# Patient Record
Sex: Female | Born: 1980 | Race: Black or African American | Hispanic: No | Marital: Married | Smoking: Never smoker
Health system: Southern US, Community
[De-identification: ages and names within clinical notes are randomized; demographics above are authoritative.]

## PROBLEM LIST (undated history)

## (undated) DIAGNOSIS — G935 Compression of brain: Secondary | ICD-10-CM

## (undated) DIAGNOSIS — R609 Edema, unspecified: Secondary | ICD-10-CM

## (undated) DIAGNOSIS — I370 Nonrheumatic pulmonary valve stenosis: Secondary | ICD-10-CM

## (undated) DIAGNOSIS — I10 Essential (primary) hypertension: Secondary | ICD-10-CM

## (undated) DIAGNOSIS — R55 Syncope and collapse: Secondary | ICD-10-CM

## (undated) HISTORY — DX: Compression of brain: G93.5

## (undated) HISTORY — PX: ROTATOR CUFF REPAIR: SHX139

## (undated) HISTORY — DX: Nonrheumatic pulmonary valve stenosis: I37.0

## (undated) HISTORY — DX: Syncope and collapse: R55

## (undated) HISTORY — PX: TUBAL LIGATION: SHX77

## (undated) HISTORY — DX: Essential (primary) hypertension: I10

## (undated) HISTORY — DX: Edema, unspecified: R60.9

---
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
## 2003-06-11 ENCOUNTER — Inpatient Hospital Stay (HOSPITAL_COMMUNITY): Admission: AD | Admit: 2003-06-11 | Discharge: 2003-06-11 | Payer: Self-pay | Admitting: *Deleted

## 2003-06-14 ENCOUNTER — Inpatient Hospital Stay (HOSPITAL_COMMUNITY): Admission: AD | Admit: 2003-06-14 | Discharge: 2003-06-14 | Payer: Self-pay | Admitting: Obstetrics and Gynecology

## 2003-09-20 ENCOUNTER — Inpatient Hospital Stay (HOSPITAL_COMMUNITY): Admission: AD | Admit: 2003-09-20 | Discharge: 2003-09-20 | Payer: Self-pay | Admitting: Obstetrics and Gynecology

## 2003-12-04 ENCOUNTER — Inpatient Hospital Stay (HOSPITAL_COMMUNITY): Admission: AD | Admit: 2003-12-04 | Discharge: 2003-12-04 | Payer: Self-pay | Admitting: Family Medicine

## 2003-12-05 ENCOUNTER — Inpatient Hospital Stay (HOSPITAL_COMMUNITY): Admission: AD | Admit: 2003-12-05 | Discharge: 2003-12-05 | Payer: Self-pay | Admitting: *Deleted

## 2004-01-31 ENCOUNTER — Inpatient Hospital Stay (HOSPITAL_COMMUNITY): Admission: AD | Admit: 2004-01-31 | Discharge: 2004-01-31 | Payer: Self-pay | Admitting: *Deleted

## 2004-02-02 ENCOUNTER — Inpatient Hospital Stay (HOSPITAL_COMMUNITY): Admission: AD | Admit: 2004-02-02 | Discharge: 2004-02-02 | Payer: Self-pay | Admitting: Obstetrics & Gynecology

## 2004-03-29 ENCOUNTER — Inpatient Hospital Stay (HOSPITAL_COMMUNITY): Admission: AD | Admit: 2004-03-29 | Discharge: 2004-03-29 | Payer: Self-pay | Admitting: Obstetrics and Gynecology

## 2004-04-21 ENCOUNTER — Inpatient Hospital Stay (HOSPITAL_COMMUNITY): Admission: AD | Admit: 2004-04-21 | Discharge: 2004-04-21 | Payer: Self-pay | Admitting: Obstetrics and Gynecology

## 2004-04-27 ENCOUNTER — Inpatient Hospital Stay (HOSPITAL_COMMUNITY): Admission: AD | Admit: 2004-04-27 | Discharge: 2004-04-27 | Payer: Self-pay | Admitting: Obstetrics & Gynecology

## 2004-06-20 ENCOUNTER — Ambulatory Visit: Payer: Self-pay | Admitting: Internal Medicine

## 2004-08-19 ENCOUNTER — Observation Stay (HOSPITAL_COMMUNITY): Admission: AD | Admit: 2004-08-19 | Discharge: 2004-08-19 | Payer: Self-pay | Admitting: Obstetrics and Gynecology

## 2004-08-30 ENCOUNTER — Inpatient Hospital Stay (HOSPITAL_COMMUNITY): Admission: AD | Admit: 2004-08-30 | Discharge: 2004-08-30 | Payer: Self-pay | Admitting: Obstetrics and Gynecology

## 2004-09-01 ENCOUNTER — Inpatient Hospital Stay (HOSPITAL_COMMUNITY): Admission: AD | Admit: 2004-09-01 | Discharge: 2004-09-01 | Payer: Self-pay | Admitting: Obstetrics & Gynecology

## 2004-09-03 ENCOUNTER — Inpatient Hospital Stay (HOSPITAL_COMMUNITY): Admission: AD | Admit: 2004-09-03 | Discharge: 2004-09-05 | Payer: Self-pay | Admitting: Obstetrics and Gynecology

## 2004-10-01 ENCOUNTER — Other Ambulatory Visit: Admission: RE | Admit: 2004-10-01 | Discharge: 2004-10-01 | Payer: Self-pay | Admitting: Obstetrics and Gynecology

## 2005-05-07 ENCOUNTER — Emergency Department (HOSPITAL_COMMUNITY): Admission: EM | Admit: 2005-05-07 | Discharge: 2005-05-07 | Payer: Self-pay | Admitting: Emergency Medicine

## 2005-06-27 ENCOUNTER — Emergency Department (HOSPITAL_COMMUNITY): Admission: EM | Admit: 2005-06-27 | Discharge: 2005-06-27 | Payer: Self-pay | Admitting: Emergency Medicine

## 2006-11-22 ENCOUNTER — Emergency Department (HOSPITAL_COMMUNITY): Admission: EM | Admit: 2006-11-22 | Discharge: 2006-11-22 | Payer: Self-pay | Admitting: Family Medicine

## 2007-06-05 ENCOUNTER — Emergency Department (HOSPITAL_COMMUNITY): Admission: EM | Admit: 2007-06-05 | Discharge: 2007-06-05 | Payer: Self-pay | Admitting: Emergency Medicine

## 2007-07-21 ENCOUNTER — Ambulatory Visit: Payer: Self-pay

## 2007-07-21 ENCOUNTER — Ambulatory Visit: Payer: Self-pay | Admitting: Cardiology

## 2007-07-21 LAB — CONVERTED CEMR LAB
AST: 13 units/L (ref 0–37)
Albumin: 4.1 g/dL (ref 3.5–5.2)
Basophils Absolute: 0 10*3/uL (ref 0.0–0.1)
Bilirubin, Direct: 0.1 mg/dL (ref 0.0–0.3)
Chloride: 102 meq/L (ref 96–112)
Creatinine, Ser: 0.4 mg/dL (ref 0.4–1.2)
Eosinophils Absolute: 0.2 10*3/uL (ref 0.0–0.6)
Eosinophils Relative: 3.5 % (ref 0.0–5.0)
Glucose, Bld: 91 mg/dL (ref 70–99)
HCT: 36.2 % (ref 36.0–46.0)
Hemoglobin: 12 g/dL (ref 12.0–15.0)
MCHC: 33.1 g/dL (ref 30.0–36.0)
MCV: 80.3 fL (ref 78.0–100.0)
Monocytes Absolute: 0.3 10*3/uL (ref 0.2–0.7)
Neutrophils Relative %: 38.3 % — ABNORMAL LOW (ref 43.0–77.0)
Potassium: 3.6 meq/L (ref 3.5–5.1)
RBC: 4.5 M/uL (ref 3.87–5.11)
RDW: 13.8 % (ref 11.5–14.6)
Sodium: 137 meq/L (ref 135–145)
TSH: 1.14 microintl units/mL (ref 0.35–5.50)
Total Bilirubin: 0.7 mg/dL (ref 0.3–1.2)
WBC: 4.5 10*3/uL (ref 4.5–10.5)

## 2007-07-30 ENCOUNTER — Ambulatory Visit: Payer: Self-pay

## 2007-07-30 ENCOUNTER — Encounter: Payer: Self-pay | Admitting: Internal Medicine

## 2007-08-27 ENCOUNTER — Ambulatory Visit: Payer: Self-pay | Admitting: Internal Medicine

## 2007-09-19 ENCOUNTER — Emergency Department (HOSPITAL_COMMUNITY): Admission: EM | Admit: 2007-09-19 | Discharge: 2007-09-19 | Payer: Self-pay | Admitting: Emergency Medicine

## 2008-01-09 ENCOUNTER — Inpatient Hospital Stay (HOSPITAL_COMMUNITY): Admission: AD | Admit: 2008-01-09 | Discharge: 2008-01-09 | Payer: Self-pay | Admitting: Obstetrics and Gynecology

## 2008-01-24 ENCOUNTER — Inpatient Hospital Stay (HOSPITAL_COMMUNITY): Admission: AD | Admit: 2008-01-24 | Discharge: 2008-01-24 | Payer: Self-pay | Admitting: Obstetrics and Gynecology

## 2008-02-11 ENCOUNTER — Emergency Department (HOSPITAL_COMMUNITY): Admission: EM | Admit: 2008-02-11 | Discharge: 2008-02-11 | Payer: Self-pay | Admitting: Emergency Medicine

## 2008-02-17 ENCOUNTER — Ambulatory Visit: Payer: Self-pay | Admitting: Internal Medicine

## 2008-02-17 ENCOUNTER — Inpatient Hospital Stay (HOSPITAL_COMMUNITY): Admission: AD | Admit: 2008-02-17 | Discharge: 2008-02-17 | Payer: Self-pay | Admitting: Obstetrics and Gynecology

## 2008-03-18 ENCOUNTER — Inpatient Hospital Stay (HOSPITAL_COMMUNITY): Admission: AD | Admit: 2008-03-18 | Discharge: 2008-03-18 | Payer: Self-pay | Admitting: Obstetrics and Gynecology

## 2008-03-20 ENCOUNTER — Ambulatory Visit: Payer: Self-pay | Admitting: Internal Medicine

## 2008-03-20 LAB — CONVERTED CEMR LAB: Cortisol, Plasma: 20.6 ug/dL

## 2008-04-13 ENCOUNTER — Inpatient Hospital Stay (HOSPITAL_COMMUNITY): Admission: AD | Admit: 2008-04-13 | Discharge: 2008-04-13 | Payer: Self-pay | Admitting: Obstetrics & Gynecology

## 2008-04-26 ENCOUNTER — Emergency Department (HOSPITAL_COMMUNITY): Admission: EM | Admit: 2008-04-26 | Discharge: 2008-04-26 | Payer: Self-pay | Admitting: Emergency Medicine

## 2008-04-26 ENCOUNTER — Inpatient Hospital Stay (HOSPITAL_COMMUNITY): Admission: RE | Admit: 2008-04-26 | Discharge: 2008-04-26 | Payer: Self-pay | Admitting: Obstetrics & Gynecology

## 2008-05-08 ENCOUNTER — Ambulatory Visit: Payer: Self-pay | Admitting: Internal Medicine

## 2008-06-08 ENCOUNTER — Ambulatory Visit: Payer: Self-pay | Admitting: Internal Medicine

## 2008-06-14 ENCOUNTER — Inpatient Hospital Stay (HOSPITAL_COMMUNITY): Admission: AD | Admit: 2008-06-14 | Discharge: 2008-06-14 | Payer: Self-pay | Admitting: Obstetrics and Gynecology

## 2008-06-22 ENCOUNTER — Ambulatory Visit: Payer: Self-pay | Admitting: Internal Medicine

## 2008-06-22 LAB — CONVERTED CEMR LAB
BUN: 4 mg/dL — ABNORMAL LOW (ref 6–23)
Basophils Absolute: 0 10*3/uL (ref 0.0–0.1)
Bilirubin Urine: NEGATIVE
Eosinophils Absolute: 0.1 10*3/uL (ref 0.0–0.7)
Eosinophils Relative: 1.9 % (ref 0.0–5.0)
GFR calc Af Amer: 193 mL/min
GFR calc non Af Amer: 160 mL/min
HCT: 35 % — ABNORMAL LOW (ref 36.0–46.0)
Hemoglobin, Urine: NEGATIVE
Ketones, ur: NEGATIVE mg/dL
MCHC: 33.5 g/dL (ref 30.0–36.0)
MCV: 85.2 fL (ref 78.0–100.0)
Monocytes Absolute: 0.6 10*3/uL (ref 0.1–1.0)
Neutrophils Relative %: 66.2 % (ref 43.0–77.0)
Platelets: 144 10*3/uL — ABNORMAL LOW (ref 150–400)
Potassium: 4 meq/L (ref 3.5–5.1)
RDW: 14.7 % — ABNORMAL HIGH (ref 11.5–14.6)
TSH: 0.92 microintl units/mL (ref 0.35–5.50)
Total Protein, Urine: NEGATIVE mg/dL
Urine Glucose: NEGATIVE mg/dL
Urobilinogen, UA: 0.2 (ref 0.0–1.0)

## 2008-07-10 ENCOUNTER — Ambulatory Visit: Payer: Self-pay

## 2008-07-10 ENCOUNTER — Encounter: Payer: Self-pay | Admitting: Internal Medicine

## 2008-07-10 ENCOUNTER — Ambulatory Visit: Payer: Self-pay | Admitting: Internal Medicine

## 2008-07-30 ENCOUNTER — Inpatient Hospital Stay (HOSPITAL_COMMUNITY): Admission: AD | Admit: 2008-07-30 | Discharge: 2008-07-30 | Payer: Self-pay | Admitting: Obstetrics and Gynecology

## 2008-07-30 ENCOUNTER — Inpatient Hospital Stay (HOSPITAL_COMMUNITY): Admission: AD | Admit: 2008-07-30 | Discharge: 2008-07-31 | Payer: Self-pay | Admitting: Obstetrics and Gynecology

## 2008-08-07 ENCOUNTER — Inpatient Hospital Stay (HOSPITAL_COMMUNITY): Admission: AD | Admit: 2008-08-07 | Discharge: 2008-08-07 | Payer: Self-pay | Admitting: Obstetrics and Gynecology

## 2008-08-20 ENCOUNTER — Observation Stay (HOSPITAL_COMMUNITY): Admission: AD | Admit: 2008-08-20 | Discharge: 2008-08-20 | Payer: Self-pay | Admitting: Obstetrics & Gynecology

## 2008-08-29 ENCOUNTER — Inpatient Hospital Stay (HOSPITAL_COMMUNITY): Admission: AD | Admit: 2008-08-29 | Discharge: 2008-08-31 | Payer: Self-pay | Admitting: Obstetrics & Gynecology

## 2008-09-25 ENCOUNTER — Ambulatory Visit: Payer: Self-pay | Admitting: Internal Medicine

## 2008-11-09 DIAGNOSIS — Z9889 Other specified postprocedural states: Secondary | ICD-10-CM | POA: Insufficient documentation

## 2008-11-09 DIAGNOSIS — R55 Syncope and collapse: Secondary | ICD-10-CM

## 2008-11-09 DIAGNOSIS — Z862 Personal history of diseases of the blood and blood-forming organs and certain disorders involving the immune mechanism: Secondary | ICD-10-CM | POA: Insufficient documentation

## 2008-11-20 ENCOUNTER — Encounter: Payer: Self-pay | Admitting: Internal Medicine

## 2008-11-20 ENCOUNTER — Ambulatory Visit: Payer: Self-pay | Admitting: Internal Medicine

## 2008-12-25 ENCOUNTER — Ambulatory Visit: Payer: Self-pay | Admitting: Cardiovascular Disease

## 2008-12-25 ENCOUNTER — Telehealth: Payer: Self-pay | Admitting: Internal Medicine

## 2008-12-25 DIAGNOSIS — R002 Palpitations: Secondary | ICD-10-CM

## 2008-12-25 DIAGNOSIS — R42 Dizziness and giddiness: Secondary | ICD-10-CM | POA: Insufficient documentation

## 2008-12-26 ENCOUNTER — Telehealth (INDEPENDENT_AMBULATORY_CARE_PROVIDER_SITE_OTHER): Payer: Self-pay | Admitting: *Deleted

## 2008-12-27 ENCOUNTER — Telehealth (INDEPENDENT_AMBULATORY_CARE_PROVIDER_SITE_OTHER): Payer: Self-pay | Admitting: *Deleted

## 2008-12-27 ENCOUNTER — Ambulatory Visit: Payer: Self-pay | Admitting: Cardiovascular Disease

## 2008-12-30 ENCOUNTER — Ambulatory Visit (HOSPITAL_COMMUNITY): Admission: RE | Admit: 2008-12-30 | Discharge: 2008-12-30 | Payer: Self-pay | Admitting: Cardiovascular Disease

## 2008-12-31 ENCOUNTER — Encounter: Payer: Self-pay | Admitting: Internal Medicine

## 2009-01-01 LAB — CONVERTED CEMR LAB
Basophils Absolute: 0 10*3/uL (ref 0.0–0.1)
Basophils Relative: 0.4 % (ref 0.0–3.0)
Eosinophils Absolute: 0.1 10*3/uL (ref 0.0–0.7)
Lymphocytes Relative: 40.9 % (ref 12.0–46.0)
MCHC: 33.5 g/dL (ref 30.0–36.0)
Monocytes Relative: 10.4 % (ref 3.0–12.0)
Neutrophils Relative %: 46.3 % (ref 43.0–77.0)
RBC: 4.44 M/uL (ref 3.87–5.11)
RDW: 13.5 % (ref 11.5–14.6)

## 2009-01-06 ENCOUNTER — Ambulatory Visit (HOSPITAL_COMMUNITY): Admission: RE | Admit: 2009-01-06 | Discharge: 2009-01-06 | Payer: Self-pay | Admitting: Cardiovascular Disease

## 2009-01-09 ENCOUNTER — Telehealth (INDEPENDENT_AMBULATORY_CARE_PROVIDER_SITE_OTHER): Payer: Self-pay | Admitting: *Deleted

## 2009-01-10 ENCOUNTER — Telehealth (INDEPENDENT_AMBULATORY_CARE_PROVIDER_SITE_OTHER): Payer: Self-pay | Admitting: *Deleted

## 2009-01-11 ENCOUNTER — Telehealth: Payer: Self-pay | Admitting: Internal Medicine

## 2009-01-11 ENCOUNTER — Ambulatory Visit: Payer: Self-pay | Admitting: Cardiovascular Disease

## 2009-01-11 ENCOUNTER — Telehealth (INDEPENDENT_AMBULATORY_CARE_PROVIDER_SITE_OTHER): Payer: Self-pay | Admitting: *Deleted

## 2009-01-11 ENCOUNTER — Ambulatory Visit: Payer: Self-pay | Admitting: Internal Medicine

## 2009-01-16 ENCOUNTER — Ambulatory Visit (HOSPITAL_COMMUNITY): Admission: RE | Admit: 2009-01-16 | Discharge: 2009-01-16 | Payer: Self-pay | Admitting: Internal Medicine

## 2009-01-16 LAB — CONVERTED CEMR LAB: hCG, Beta Chain, Quant, S: >29780 milliintl units/mL

## 2009-01-18 ENCOUNTER — Ambulatory Visit: Payer: Self-pay | Admitting: Internal Medicine

## 2009-01-18 DIAGNOSIS — R599 Enlarged lymph nodes, unspecified: Secondary | ICD-10-CM | POA: Insufficient documentation

## 2009-01-23 ENCOUNTER — Encounter: Payer: Self-pay | Admitting: Internal Medicine

## 2009-01-24 ENCOUNTER — Telehealth: Payer: Self-pay | Admitting: Internal Medicine

## 2009-01-25 ENCOUNTER — Telehealth: Payer: Self-pay | Admitting: Internal Medicine

## 2009-02-16 ENCOUNTER — Telehealth: Payer: Self-pay | Admitting: Internal Medicine

## 2009-02-20 ENCOUNTER — Encounter: Payer: Self-pay | Admitting: Internal Medicine

## 2009-02-21 ENCOUNTER — Telehealth: Payer: Self-pay | Admitting: Internal Medicine

## 2009-02-28 ENCOUNTER — Telehealth (INDEPENDENT_AMBULATORY_CARE_PROVIDER_SITE_OTHER): Payer: Self-pay | Admitting: *Deleted

## 2009-04-16 ENCOUNTER — Telehealth: Payer: Self-pay | Admitting: Internal Medicine

## 2009-04-26 ENCOUNTER — Ambulatory Visit: Payer: Self-pay | Admitting: Internal Medicine

## 2009-04-27 ENCOUNTER — Telehealth (INDEPENDENT_AMBULATORY_CARE_PROVIDER_SITE_OTHER): Payer: Self-pay | Admitting: *Deleted

## 2009-04-27 LAB — CONVERTED CEMR LAB
Basophils Absolute: 0 10*3/uL (ref 0.0–0.1)
Bilirubin Urine: NEGATIVE
CO2: 27 meq/L (ref 19–32)
Chloride: 102 meq/L (ref 96–112)
Creatinine,U: 130.5 mg/dL
Leukocytes, UA: NEGATIVE
Lymphocytes Relative: 36.6 % (ref 12.0–46.0)
Monocytes Relative: 8.3 % (ref 3.0–12.0)
Nitrite: NEGATIVE
Opiates: NEGATIVE
Phencyclidine (PCP): NEGATIVE
Platelets: 206 10*3/uL (ref 150.0–400.0)
Potassium: 3.2 meq/L — ABNORMAL LOW (ref 3.5–5.1)
Propoxyphene: NEGATIVE
RDW: 13.9 % (ref 11.5–14.6)
Sodium: 137 meq/L (ref 135–145)
Specific Gravity, Urine: >=1.03 (ref 1.000–1.030)
TSH: 0.84 microintl units/mL (ref 0.35–5.50)
hCG, Beta Chain, Quant, S: 0.71 milliintl units/mL
pH: 6 (ref 5.0–8.0)

## 2009-04-30 ENCOUNTER — Ambulatory Visit: Payer: Self-pay | Admitting: Internal Medicine

## 2009-05-01 ENCOUNTER — Encounter: Payer: Self-pay | Admitting: Internal Medicine

## 2009-05-02 LAB — CONVERTED CEMR LAB
BUN: 9 mg/dL
CO2: 28 meq/L
Calcium: 9.3 mg/dL
Chloride: 108 meq/L
Creatinine, Ser: 0.7 mg/dL
GFR calc non Af Amer: 129.49 mL/min
Glucose, Bld: 87 mg/dL
Potassium: 3.8 meq/L
Sodium: 140 meq/L

## 2009-05-03 ENCOUNTER — Encounter: Payer: Self-pay | Admitting: Internal Medicine

## 2009-05-09 ENCOUNTER — Encounter: Payer: Self-pay | Admitting: Internal Medicine

## 2009-05-09 ENCOUNTER — Ambulatory Visit: Payer: Self-pay | Admitting: Internal Medicine

## 2009-05-09 ENCOUNTER — Ambulatory Visit (HOSPITAL_COMMUNITY): Admission: RE | Admit: 2009-05-09 | Discharge: 2009-05-09 | Payer: Self-pay | Admitting: Internal Medicine

## 2009-05-11 ENCOUNTER — Ambulatory Visit: Payer: Self-pay | Admitting: Internal Medicine

## 2009-05-11 DIAGNOSIS — I10 Essential (primary) hypertension: Secondary | ICD-10-CM | POA: Insufficient documentation

## 2009-05-23 ENCOUNTER — Telehealth (INDEPENDENT_AMBULATORY_CARE_PROVIDER_SITE_OTHER): Payer: Self-pay | Admitting: *Deleted

## 2009-06-04 ENCOUNTER — Telehealth: Payer: Self-pay | Admitting: Internal Medicine

## 2009-06-14 ENCOUNTER — Telehealth (INDEPENDENT_AMBULATORY_CARE_PROVIDER_SITE_OTHER): Payer: Self-pay | Admitting: *Deleted

## 2009-06-15 ENCOUNTER — Ambulatory Visit: Payer: Self-pay | Admitting: Internal Medicine

## 2009-07-24 ENCOUNTER — Encounter: Payer: Self-pay | Admitting: Internal Medicine

## 2009-08-20 ENCOUNTER — Inpatient Hospital Stay (HOSPITAL_COMMUNITY): Admission: AD | Admit: 2009-08-20 | Discharge: 2009-08-20 | Payer: Self-pay | Admitting: Obstetrics & Gynecology

## 2009-08-20 ENCOUNTER — Telehealth: Payer: Self-pay | Admitting: Internal Medicine

## 2009-08-23 ENCOUNTER — Telehealth (INDEPENDENT_AMBULATORY_CARE_PROVIDER_SITE_OTHER): Payer: Self-pay | Admitting: *Deleted

## 2009-08-24 ENCOUNTER — Ambulatory Visit (HOSPITAL_COMMUNITY): Admission: RE | Admit: 2009-08-24 | Discharge: 2009-08-24 | Payer: Self-pay | Admitting: Obstetrics & Gynecology

## 2009-09-14 ENCOUNTER — Ambulatory Visit: Payer: Self-pay | Admitting: Internal Medicine

## 2009-11-09 ENCOUNTER — Telehealth: Payer: Self-pay | Admitting: Internal Medicine

## 2009-12-21 ENCOUNTER — Ambulatory Visit: Payer: Self-pay | Admitting: Internal Medicine

## 2010-01-21 IMAGING — US US OB COMP LESS 14 WK
1 series · 13 of 28 positions shown · non-contrast
Comparison: None this gestation.

presenting with crampy pelvic and lower abdominal pain.
Quantitative beta HCG 3303.

OBSTETRIC <14 WK US AND TRANSVAGINAL OB US 01/09/2008:
TECHNIQUE: Both transabdominal and transvaginal ultrasound
examinations were performed for complete evaluation of the
gestation as well as the maternal uterus, adnexal regions, and
pelvic cul-de-sac.

[Series 1: us ob comp less 14 wk · 0.28mm/px · 13 of 50 slices shown]
[im 2/50]
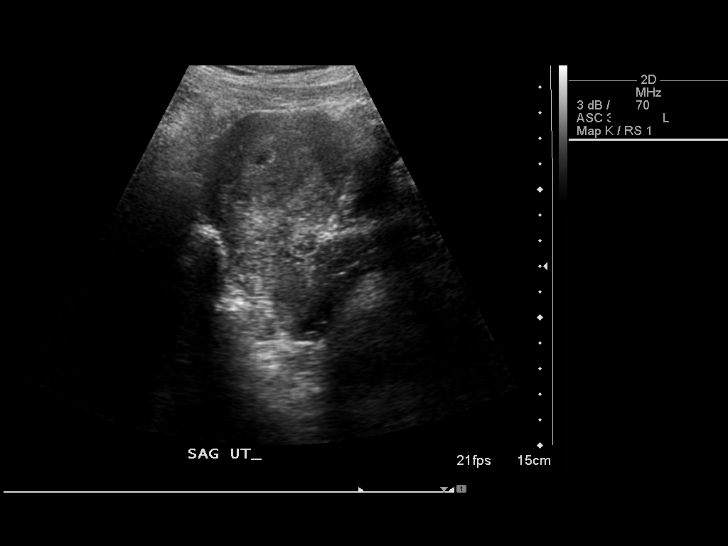
[im 6/50]
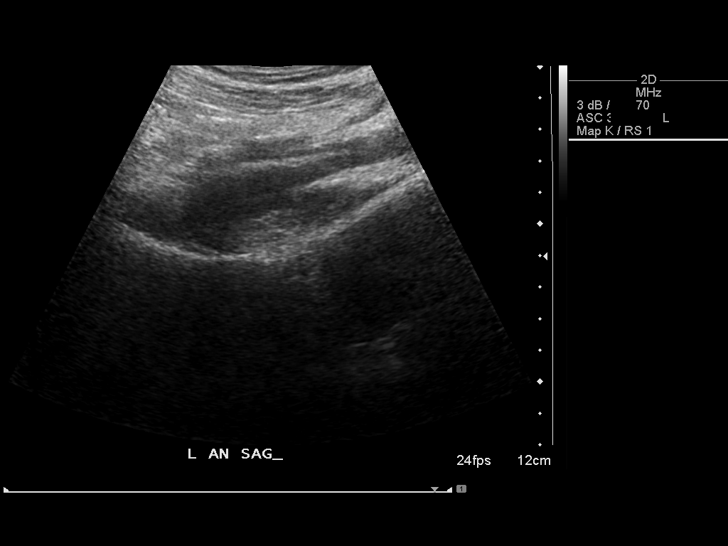
[im 10/50]
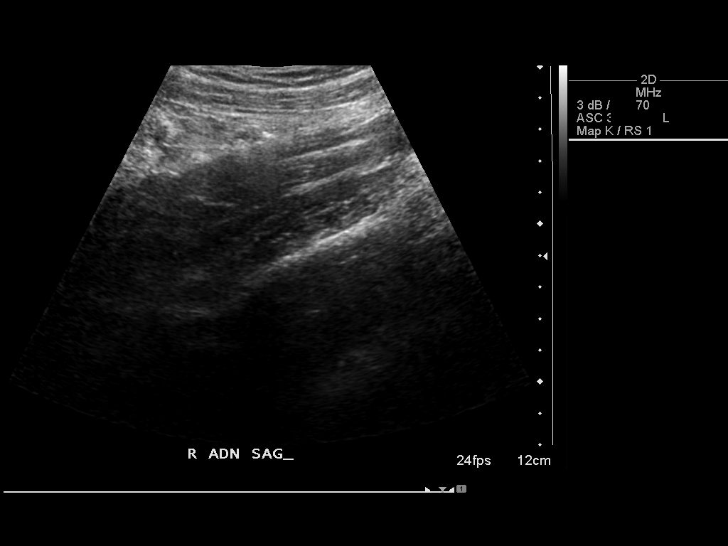
[im 13/50]
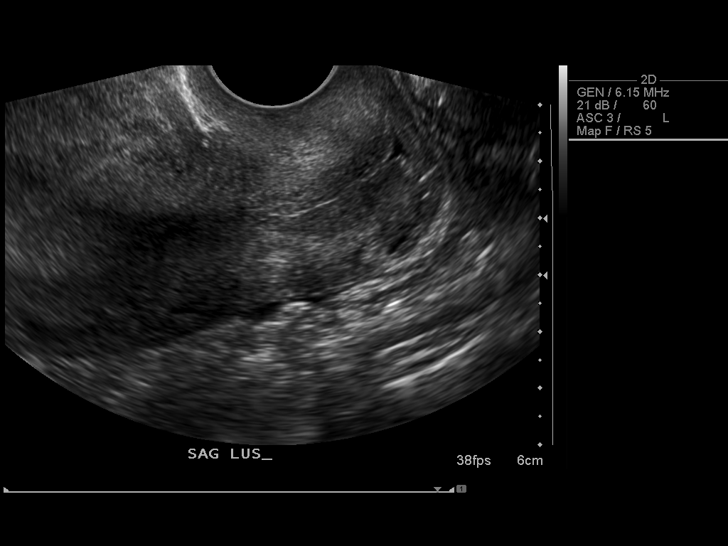
[im 17/50]
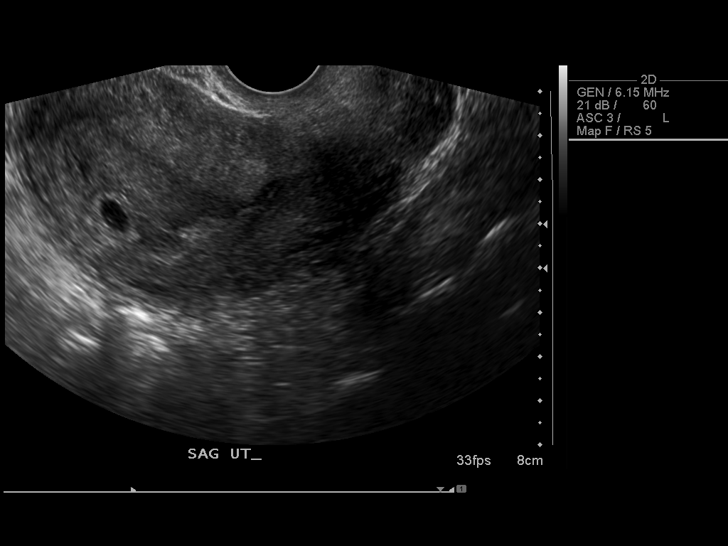
[im 20/50]
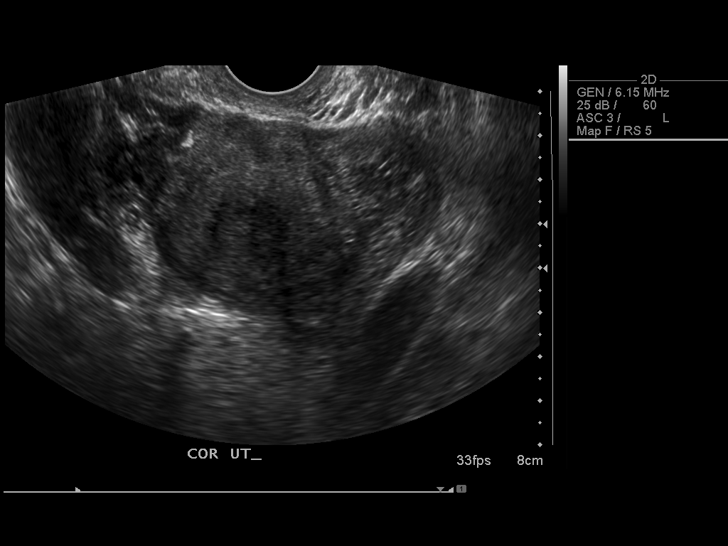
[im 26/50]
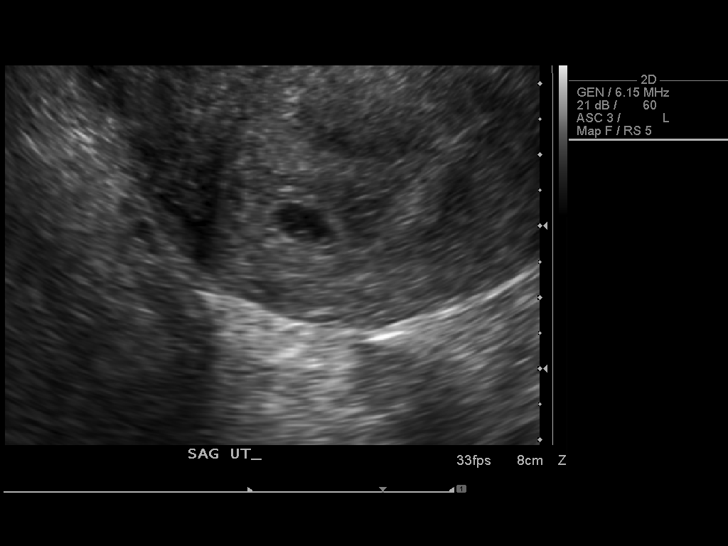
[im 30/50]
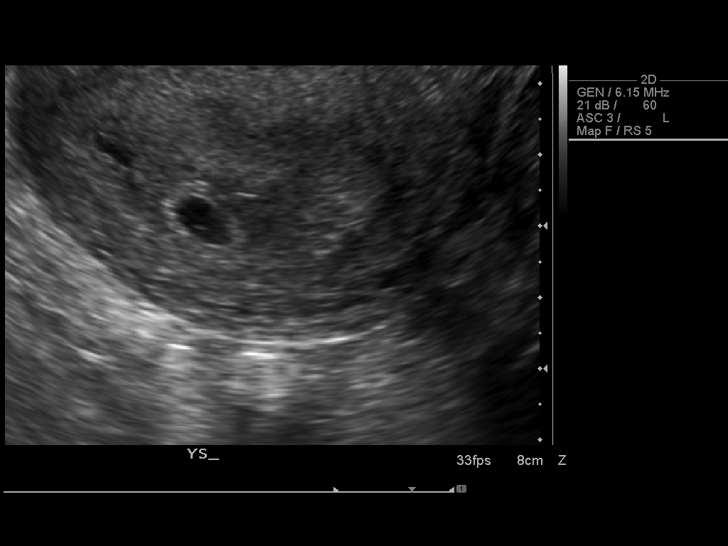
[im 33/50]
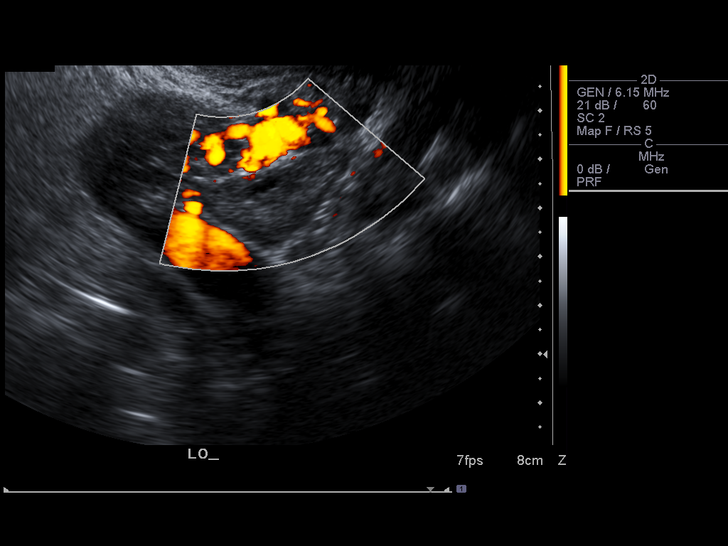
[im 37/50]
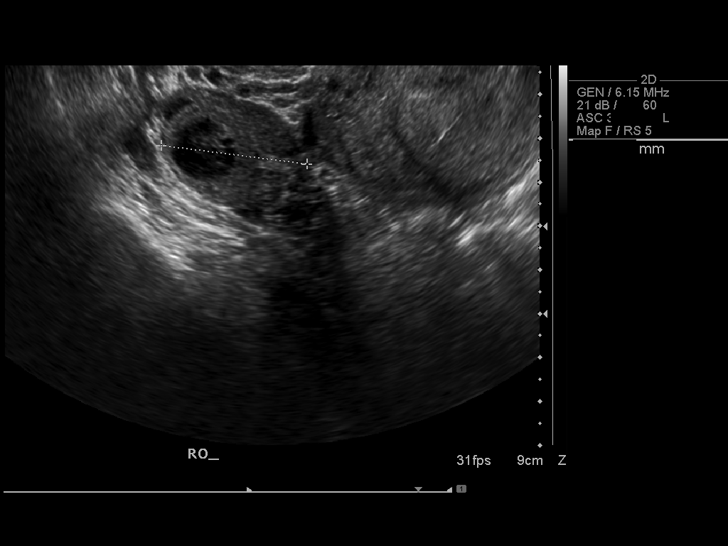
[im 40/50]
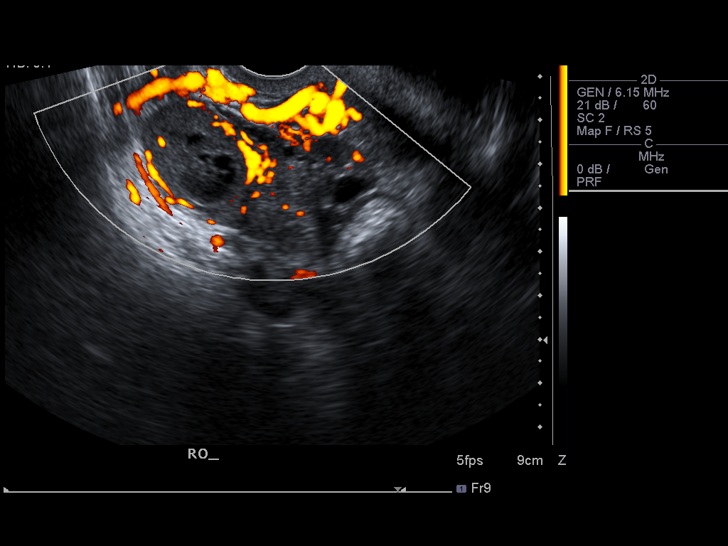
[im 44/50]
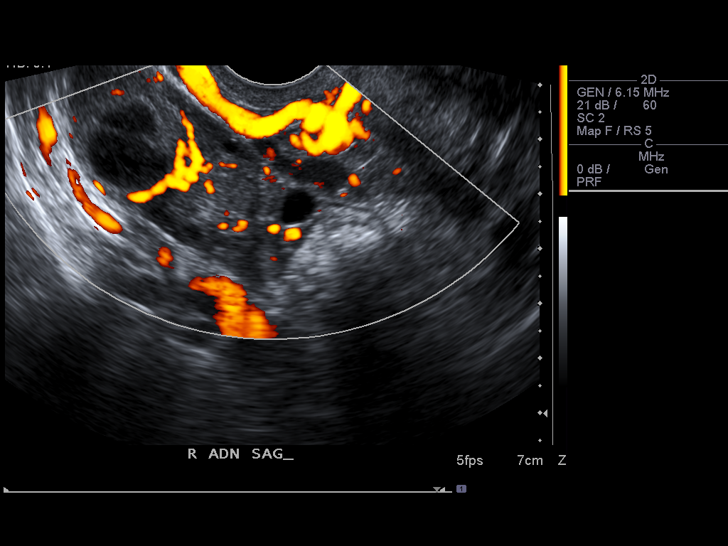
[im 48/50]
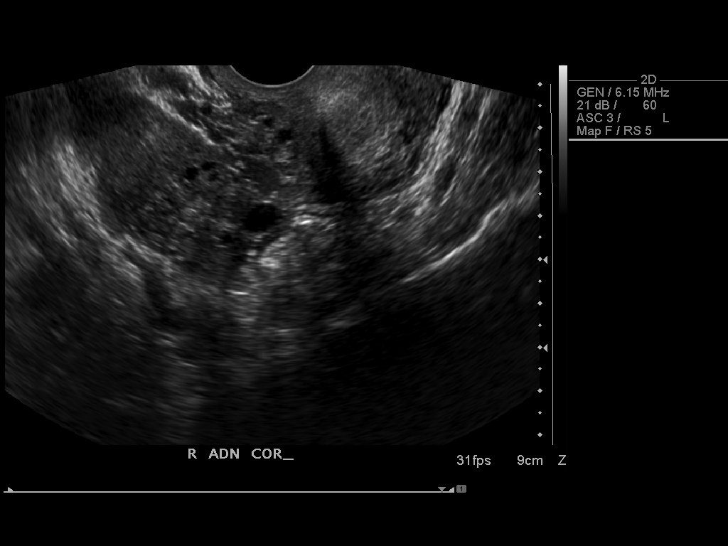

[13 of 28 positions shown; findings below may reference images not displayed]

Intrauterine gestational sac: Single
Yolk sac: Visible.
Embryo: Not yet visible.
Cardiac Activity: Not yet visible.

MSD: 6.8  mm  5 w 3 d
US EDC: 09/07/2008

Maternal uterus/adnexae:
Very small subchorionic hemorrhage.  Left ovary normal in size and
a appearance, containing several small follicles, measuring
approximately 3.0 x 1.5 x 2.0 cm.  Right ovary mildly enlarged
measuring approximately 5.3 x 2.5 x 3.4 cm, containing a complex
cyst on the order of 1.3 cm, possibly a corpus luteum cyst.  An
approximate 1 cm cyst also noted in the right adnexa adjacent to,
but likely separate from, the ovary.  No other adnexal masses.  No
free fluid.
IMPRESSION: 1.  Single intrauterine gestational sac with estimated gestational
age 5 weeks 3 days by mean sac diameter.  Visible yolk sac.  Embryo
not yet visible. US EDC 09/07/2008.
2.  Very small subchorionic hemorrhage.
3.  Probable 1.3 cm hemorrhagic corpus luteum cyst right ovary.
4.  Approximate 1 cm cyst in the right adnexa adjacent to but
separate from the right ovary, likely paraovarian cyst.

While highly unlikely that the cyst in the right adnexa and
represents a gestational sac in the right fallopian tube,
particularly given the extreme rarity of combined intrauterine and
ectopic pregnancy, serial ultrasound follow-up may be beneficial to
exclude that possibility.

## 2010-02-20 ENCOUNTER — Telehealth: Payer: Self-pay | Admitting: Internal Medicine

## 2010-02-21 ENCOUNTER — Telehealth (INDEPENDENT_AMBULATORY_CARE_PROVIDER_SITE_OTHER): Payer: Self-pay | Admitting: *Deleted

## 2010-02-21 ENCOUNTER — Ambulatory Visit: Payer: Self-pay | Admitting: Internal Medicine

## 2010-03-28 ENCOUNTER — Ambulatory Visit: Payer: Self-pay | Admitting: Nurse Practitioner

## 2010-03-28 ENCOUNTER — Inpatient Hospital Stay (HOSPITAL_COMMUNITY): Admission: AD | Admit: 2010-03-28 | Discharge: 2010-03-28 | Payer: Self-pay | Admitting: Obstetrics & Gynecology

## 2010-04-01 ENCOUNTER — Ambulatory Visit (HOSPITAL_COMMUNITY): Admission: RE | Admit: 2010-04-01 | Discharge: 2010-04-01 | Payer: Self-pay | Admitting: Obstetrics & Gynecology

## 2010-04-03 ENCOUNTER — Telehealth: Payer: Self-pay | Admitting: Internal Medicine

## 2010-04-12 ENCOUNTER — Telehealth (INDEPENDENT_AMBULATORY_CARE_PROVIDER_SITE_OTHER): Payer: Self-pay | Admitting: *Deleted

## 2010-04-15 ENCOUNTER — Ambulatory Visit: Payer: Self-pay | Admitting: Internal Medicine

## 2010-04-15 DIAGNOSIS — Q054 Unspecified spina bifida with hydrocephalus: Secondary | ICD-10-CM | POA: Insufficient documentation

## 2010-04-17 ENCOUNTER — Telehealth: Payer: Self-pay | Admitting: Internal Medicine

## 2010-04-26 ENCOUNTER — Ambulatory Visit (HOSPITAL_COMMUNITY): Admission: RE | Admit: 2010-04-26 | Discharge: 2010-04-26 | Payer: Self-pay | Admitting: Internal Medicine

## 2010-04-26 ENCOUNTER — Ambulatory Visit: Payer: Self-pay | Admitting: Cardiology

## 2010-04-26 ENCOUNTER — Ambulatory Visit: Payer: Self-pay

## 2010-04-26 ENCOUNTER — Encounter: Payer: Self-pay | Admitting: Internal Medicine

## 2010-05-07 ENCOUNTER — Telehealth (INDEPENDENT_AMBULATORY_CARE_PROVIDER_SITE_OTHER): Payer: Self-pay | Admitting: *Deleted

## 2010-05-14 ENCOUNTER — Encounter: Payer: Self-pay | Admitting: Internal Medicine

## 2010-06-13 ENCOUNTER — Ambulatory Visit: Payer: Self-pay | Admitting: Internal Medicine

## 2010-06-13 DIAGNOSIS — J069 Acute upper respiratory infection, unspecified: Secondary | ICD-10-CM | POA: Insufficient documentation

## 2010-09-04 ENCOUNTER — Encounter (INDEPENDENT_AMBULATORY_CARE_PROVIDER_SITE_OTHER): Payer: Self-pay | Admitting: *Deleted

## 2010-09-09 ENCOUNTER — Encounter: Payer: Self-pay | Admitting: Cardiovascular Disease

## 2010-09-17 NOTE — Assessment & Plan Note (Signed)
Summary: rov/jr   Visit Type:  Follow-up Primary Provider:  [facility]  CC:  pt is 12 weeks into her 3rd pregnancy- She is not having any dizziness at this time.  History of Present Illness: Patient is a 30 year old with a history of mild pulmonary stenosis and also syncope.  I saw her in clinic a couple of months ago.  Since I saw her she has found out that she is pregnant.  She is entering her second trimester.   Since seen, she has felt very good.  She denies dizziness.  She denies shortness of breath.  Says she is feeling very good.  Current Medications (verified): 1)  Pre Natal Vitamins .... One Tab Once Daily 2)  Pindolol 5 Mg Tabs (Pindolol) .Marland Kitchen.. 1 Tablet in The Morning and 1/2 Tablet in The Evening (Hold)  Allergies (verified): No Known Drug Allergies  Past History:  Past medical, surgical, family and social histories (including risk factors) reviewed, and no changes noted (except as noted below).  Past Medical History: Reviewed history from 05/11/2009 and no changes required. 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 ROTATOR CUFF REPAIR, LEFT, HX OF (ICD-V45.89)  Family History: Reviewed history from 11/09/2008 and no changes required. Mother:hx of general tonic-clonic seizure.  Family hx of stroke, hypertension, diabetes.  Social History: Reviewed history from 12/25/2008 and no changes required. No tobacco No alcohol No illicit drugs Single, 2 children  Vital Signs:  Patient profile:   30 year old female Height:      64 inches Weight:      193 pounds Pulse rate:   87 / minute Pulse (ortho):   94 / minute BP standing:   121 / 77 Cuff size:   large  Vitals Entered By: Burnett Kanaris, CNA (February 21, 2010 9:48 AM)  Serial Vital Signs/Assessments:  Time      Position  BP       Pulse  Resp  Temp      By 0 min     Lying LA  118/74   84                    Burnett Kanaris, CNA 0 min     Sitting   119/74   86                    Burnett Kanaris, CNA 0 min     Standing  121/77   94                    Burnett Kanaris, CNA 2 min     Standing  120/75   100                   Burnett Kanaris, CNA 3 min     Standing  127/82   99                    Burnett Kanaris, CNA  Comments: 0 min no dizziness By: Burnett Kanaris, CNA  2 min no dizziness By: Burnett Kanaris, CNA  3 min no dizziness By: Burnett Kanaris, CNA    Physical Exam  Additional Exam:  Patient is in NAD HEENT:  Normocephalic, atraumatic. EOMI, PERRLA.  Neck: JVP is normal. No thyromegaly. No bruits.  Lungs: clear to auscultation. No rales no wheezes.  Heart: Regular rate and rhythm. Normal S1, S2. No S3.   Gr I/VI systolic murmur. PMI not displaced.  Abdomen:  Distended.   Normal bowel sounds.  No hepatomegaly.  Extremities:   Good distal pulses throughout. No lower extremity edema.  Musculoskeletal :moving all extremities.  Neuro:   alert and oriented x3.    Impression & Recommendations:  Problem # 1:  SYNCOPE (ICD-780.2) Patient is off all meds at present.  She is feeling well.  I would encourage her to stay hydrated, take in adequate salt.  Activities as tolerated.I will be available as needed if she develops problems with dizziness, syncope.  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  Follow.  Other Orders: Echocardiogram (Echo)

## 2010-09-17 NOTE — Assessment & Plan Note (Signed)
Summary: 6wk f/u sl   Visit Type:  Follow-up  CC:  pt has experinced bad headaches which usually  carries into pt haviny dizzy spells..  Current Medications (verified): 1)  Multivitamins .... One Tab Once Daily 2)  Furosemide 20 Mg Tabs (Furosemide) .Marland Kitchen.. 1 Tablet Every Day (  Take Only If There Is Swelling in Ankles and Feet ) 3)  Klor-Con M20 20 Meq Cr-Tabs (Potassium Chloride Crys Cr) .Marland Kitchen.. 1 Tablet Every Day in The Am ( Take Only When You Take Lasix ) 4)  Pindolol 5 Mg Tabs (Pindolol) .... 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) SYNCOPE (ICD-780.2) Dizziness Headaches    Vital Signs:  Patient profile:   30 year old female Height:      64 inches Weight:      197 pounds Pulse (ortho):   91 / minute BP standing:   140 / 94  Vitals Entered By: Burnett Kanaris, CNA (September 14, 2009 9:11 AM)  Serial Vital Signs/Assessments:  Time      Position  BP       Pulse  Resp  Temp     By 0 min     Lying LA  140/90   88                    Burnett Kanaris, CNA 0 min     Sitting   131/84   69                    Burnett Kanaris, CNA 0 min     Standing  140/94   91                    Burnett Kanaris, CNA 2 min     Standing  138/92   92                    Burnett Kanaris, CNA      Standing  136/87   90                    Burnett Kanaris, CNA  Comments: 0 min during first three blood pressures-Pt had some dizziness By: Burnett Kanaris, CNA  2 min  pt expericing dizziness By: Burnett Kanaris, CNA  pt ready to sit down she is feeling dizzy By: Burnett Kanaris, CNA    Prescriptions: PINDOLOL 5 MG TABS (PINDOLOL) 1 tablet 2 times per day  #60 x 6   Entered by:   Layne Benton, RN, BSN   Authorized by:   Sherrill Raring, MD, [facility]   Signed by:   Layne Benton, RN, BSN on 09/14/2009   Method used:    Electronically to        [facility]  [address] [id number] ([facility])       [address]       [place], [place]  [address]       Ph: [phone] or [phone]       Fax: [phone]   RxID:   [id number]   Appended Document: 6wk f/u sl Patient a 30 year old with a history of mild PS/PI and also a history of dizziness and syncope.  In addition she has a history of Chiari malformation.  Is scheduled  to be seen in neurology. I last saw her in November. Since seen underwent a D&C for an unplanned pregnancy that ultimately failed to progress.  She reported that she tolerated the procedure well. From the standpoint of dizziness and syncope she reports that she is doing some better.  She has some dizziness but has not had any episodes of syncope.  (Note negative tilt table in the fall) Her biggest complaint are severe headaches. Exam:  Patient in NAD Neck:  JVP is normal Lungs:  CTA. Cardiac:  RRR.  Gr I -II/VI systolic murmur LUSB Abdomen:  Normal bowel sounds.  No hepatomegaly. NO masses Extremities:  No edema.  2+ pulses. Plan:  Dizziness:  Improving.  I am actually recommending that she increase her pindolol to help bp. F/U in a few months. HTN:  as noted Neuro:  As appt scheduled for evaluation

## 2010-09-17 NOTE — Progress Notes (Signed)
Summary: info to [facility]  Phone Note Call from Patient   Reason for Call: Talk to Nurse Summary of Call: pt wants to know if we sent [employer] a fax for her from  july to august -pls call [phone] Initial call taken by: Glynda Jaeger,  April 17, 2010 9:29 AM  Follow-up for Phone Call        [facility] and they advised me that they received [employer] paper work on 8/30 and it would be completed within 1 week. I called patient back and advised her of above. Layne Benton, RN, BSN  April 17, 2010 10:16 AM

## 2010-09-17 NOTE — Progress Notes (Signed)
  Phone Note Other Incoming   Caller: [facility] Action Taken: Information Sent Initial call taken by: Denny Peon    Faxed 12 lead over to [facility] to fax [phone] [facility]  August 23, 2009 8:49 AM

## 2010-09-17 NOTE — Assessment & Plan Note (Signed)
Summary: rov/sl   Visit Type:  Follow-up Primary Provider:  [facility]  CC:  palpitations.  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  I last saw her earlier this summer.  At that time she was doing well.  She just found out that she was pregnant. Since then, she  is now [redacted] wks pregnant.  She began having syncopal spells about 2 wks ago.  She has had 4 spells total.  She says the spells have happening while she is doing things, often chores around the house.  Her prodrome for the spells is having her hearing get muffled and feeling light headed.  She has had a near syncopal spell earlier today.  Does note more frequent headaches. She says she is getting adequate fluids and salt.    Current Medications (verified): 1)  Pre Natal Vitamins .... One Tab Once Daily  Allergies (verified): No Known Drug Allergies  Past History:  Past medical, surgical, family and social histories (including risk factors) reviewed, and no changes noted (except as noted below).  Past Medical History: Reviewed history from 05/11/2009 and no changes required. 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 ROTATOR CUFF REPAIR, LEFT, HX OF (ICD-V45.89)  Family History: Reviewed history from 11/09/2008 and no changes required. Mother:hx of general tonic-clonic seizure.  Family hx of stroke, hypertension, diabetes.  Social History: Reviewed history from 12/25/2008 and no changes required. No tobacco No alcohol No illicit drugs Single, 2 children  Vital Signs:  Patient profile:   30 year old female Height:      64 inches Weight:      196 pounds BMI:     33.76 Pulse rate:   93 / minute Pulse (ortho):   92 /  minute Pulse rhythm:   regular Resp:     18 per minute BP standing:   110 / 69  Vitals Entered By: Vikki Ports (April 15, 2010 4:41 PM)  Serial Vital Signs/Assessments:  Time      Position  BP       Pulse  Resp  Temp     By           Lying LA  107/71   83                    Luz Jaramillo           Sitting   114/69   85                    Luz Jaramillo           Standing  110/69   92                    Luz Jaramillo           Standing  114/68   96                    Luz Jaramillo           Standing  110/72   92                    Vikki Ports  Comments: Lying No symptoms Sitting- some dizziness Standing- Little dizzy Standing  2 minutes- No symptoms Standing 3 minutes- No symptoms By: Vikki Ports    Physical Exam  Additional Exam:  patient in NAD HEENT:  Normocephalic, atraumatic. EOMI, PERRLA.  Neck: JVP is normal. No thyromegaly. No bruits.  Lungs: clear to auscultation. No rales no wheezes.  Heart: Regular rate and rhythm. Normal S1, S2. No S3.  Gr I/VI systolic murmur LUSB.  No diastolic murmurs.  Abdomen:  Distended.  Extremities:   Good distal pulses throughout. No lower extremity edema.  Musculoskeletal :moving all extremities.  Neuro:   alert and oriented x3.    Impression & Recommendations:  Problem # 1:  SYNCOPE (ICD-780.2) Recurrent spells.  With her pregnancy I am reluctant to Rx anything, especially without objective data showing orthostasis. I encouraged her to get ample fluids and salt.  I also encouraged that she seek the help of others with chores around the house. I will be in touch with her OB.  I will also discuss with pharmacy.  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 Orders: Neurology Referral (Neuro)  Other Orders: EKG w/ Interpretation (93000)  Appended Document: rov/sl EKG:  NSR .  95 bpm.

## 2010-09-17 NOTE — Progress Notes (Signed)
Summary: talk to nurse  Phone Note Call from Patient Call back at Home Phone [phone]   Caller: Patient Summary of Call: per pt would like to discuss that she would like to return to work.  Initial call taken by: Edman Circle,  November 09, 2009 11:49 AM  Follow-up for Phone Call        Called patient back...she states that she is feeling much better...no dizzy spells or syncope. She has been exercising on a regular basis which she feels has helped.  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 She does not want to go back to work until June or July anyway. Follow-up by: Suzan Garibaldi RN

## 2010-09-17 NOTE — Progress Notes (Signed)
  request recieved from [facility] [employer] sent to [facility]  April 12, 2010 11:16 AM

## 2010-09-17 NOTE — Progress Notes (Signed)
Summary: pt wants sooner appt  Phone Note Call from Patient Call back at Home Phone [phone] Call back at [phone]    Caller: Patient Reason for Call: Talk to Nurse, Talk to Doctor Summary of Call: pt is pregnant and is wanting a sooner appt. she realize she was told not to have anymore children but with a new marriage they wanted at least one more and she wants to get Dr. Tenny Craw input on this. She is in her 2nd trimester. Initial call taken by: Omer Jack,  February 20, 2010 3:17 PM  Follow-up for Phone Call        Called patient back...advised new opening in Dr.Doria Fern' schedule tomorrow at 9 am...she states that she will be here.  Follow-up by: J REISS RN     Appended Document: pt wants sooner appt Patient would like to return to work on 04/08/2010..will watch for disability forms.

## 2010-09-17 NOTE — Progress Notes (Signed)
  Request for Records from [facility] sent to [facility]  February 21, 2010 2:39 PM     Appended Document:  2nd request recieved from [facility] sent to [facility]

## 2010-09-17 NOTE — Assessment & Plan Note (Signed)
Summary: ROV   Visit Type:  rov Primary Provider:  [facility]  CC:  sob....edema/legs/feet/ankles....pt states she had a syncope episode 2 wks ago.....  History of Present Illness: patient is a 30 year old with a history of mild pulmonic stenosis, syncope.  I last saw her in August.  The patient had a difficult 2nd pregnancy 2 years ago with dizziness and syncope.  Note that tilt table after was negative.  the patient had an MRI that showed a Chiari malformation.  She was just seen by neuro.  When I saw her this spring she was doing well   Not pregnannt.  I saw her in late summer.  She found out that she was pregnant  She was beginning to have problems with dizziness at that time. Since then she has had intermittednt dizziness  I encouraged her to increase her fluid and salt intake.  She has also had  episodes of syncope.  She says she can tell when they are coming on.  She does not get a lot of warning however.  Overall she says that this pregnanncy is better than the last.  She is suffering from a URI.  Children were sick.  She denies fever but does note cough productive of yellow sputum   Current Medications (verified): 1)  Pre Natal Vitamins .... One Tab Once Daily  Allergies (verified): No Known Drug Allergies  Past History:  Past medical, surgical, family and social histories (including risk factors) reviewed, and no changes noted (except as noted below).  Past Medical History: Reviewed history from 05/11/2009 and no changes required. 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 ROTATOR CUFF REPAIR, LEFT, HX OF (ICD-V45.89)  Family History: Reviewed history from 11/09/2008 and no changes required. Mother:hx of general tonic-clonic seizure.  Family hx of stroke, hypertension, diabetes.  Social History: Reviewed  history from 12/25/2008 and no changes required. No tobacco No alcohol No illicit drugs Single, 2 children  Vital Signs:  Patient profile:   30 year old female Height:      64 inches Weight:      201 pounds BMI:     34.63 Pulse (ortho):   99 / minute BP standing:   101 / 63  Vitals Entered By: Danielle Rankin, CMA (June 13, 2010 10:39 AM)  Serial Vital Signs/Assessments:  Time      Position  BP       Pulse  Resp  Temp     By           Lying LA  103/66   86                    Layne Benton, RN, BSN           Sitting   109/70   88                    Layne Benton, Charity fundraiser, BSN           Standing  101/63   99                    Layne Benton, Charity fundraiser, BSN  Comments: standing at 3 minutes  BP 104/70 HR 101  By: Layne Benton, RN, BSN    Physical Exam  Additional Exam:  patient is in NAD HEENT:  Normocephalic, atraumatic. EOMI, PERRLA.  Neck: JVP is normal. No thyromegaly. No bruits.  Lungs: clear to auscultation. No rales no wheezes.  Heart: Regular rate and rhythm. Normal S1, S2. No S3.   No significant murmurs. PMI not displaced.  Abdomen: Distended with pregnancy.  Extremities:   Good distal pulses throughout. No lower extremity edema.  Musculoskeletal :moving all extremities.  Neuro:   alert and oriented x3.    Impression & Recommendations:  Problem # 1:  SYNCOPE (ICD-780.2) Patient is not orthostatic on exam today.    She is having intermittent episodes of syncope. I have asked her to stay adequately hydrated.  Avoid prolonged standing, stooping.  She should do chores for short periords.  Ask for help I would not plan any furhter Rx.  She went through the last pregnancy with more problems.  I will be available if needed but otherwise see her after delivery.  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  Problem # 4:  URI (ICD-465.9) Will contact OB.   Encouraged fluid intake.  Patient Instructions: 1)  Your physician recommends that you schedule a follow-up appointment in: 4 months

## 2010-09-17 NOTE — Letter (Signed)
Summary: Generic Letter  Architectural technologist, Main Office  [address]   [place], [place] [address]   Phone: [phone]  Fax: [phone]    05/14/2010  Brandy Carpenter [address] [place], [place]  [address]  Dear Brandy Carpenter,  We have been trying unsuccessfully to contact you by phone concerning your heart ultrasound results. Please call us with a working phone number. Our office phone is [phone].         Sincerely,   Layne Benton, RN, BSN  Dr.Sophie Quiles Tenny Craw

## 2010-09-17 NOTE — Progress Notes (Signed)
Summary: talk to nurse about options  Phone Note Call from Patient Call back at [phone]   Reason for Call: Talk to Nurse Summary of Call: pt was seen by GYN dr today and is having a miscarriage, has some options to talk to nurse about Initial call taken by: Migdalia Dk,  August 20, 2009 10:43 AM  Follow-up for Phone Call        Called patient, she states that she saw her OB/GYN doctor today and she is pregnant even though she has been on a birth control pill .She states that she was not aware that she was pregnant. Her OB/GYN doctor has advised her that the fetus is not progressing and that her 2 choices are a tablet to insert vaginally to cause a slow abortion over 1 to 2 days or a surgical procedure, which he prefers.  Advised her that the slow abortion may make her pass out because of cramping and that the surgical procedure  is probably bettter. Dr.Haily Caley aware and concurs.Domenic Moras is aware. She is scheduled to be seen in our office at the end of the month. Follow-up by: Suzan Garibaldi RN

## 2010-09-17 NOTE — Assessment & Plan Note (Signed)
Summary: PER [facility] OUT/SAF   Visit Type:  Follow-up Primary Provider:  none  CC:  follow up  on BP.  Pt has been keeping pressure log but has forgotten to bring it today. Pt has not needed to take Furosemide or potassium.  Only taking BP med.  History of Present Illness: Patient a 30 year old with a history of mild PS/PI and also a history of dizziness and syncope.  In addition she has a history of Chiari malformation. I last saw her in January. since I saw her last she is doing much better.  She says she has improved 110%.  No dizziness.  Breathing is good.  She is back to doing usual activities.  Current Medications (verified): 1)  Multivitamins .... One Tab Once Daily 2)  Furosemide 20 Mg Tabs (Furosemide) .Marland Kitchen.. 1 Tablet Every Day (  Take Only If There Is Swelling in Ankles and Feet ) 3)  Klor-Con M20 20 Meq Cr-Tabs (Potassium Chloride Crys Cr) .Marland Kitchen.. 1 Tablet Every Day in The Am ( Take Only When You Take Lasix ) 4)  Pindolol 5 Mg Tabs (Pindolol) .Marland Kitchen.. 1 Tablet in The Morning and 1/2 Tablet in The Evening  Allergies (verified): No Known Drug Allergies  Past History:  Family History: Last updated: 11/09/2008 Mother:hx of general tonic-clonic seizure.  Family hx of stroke, hypertension, diabetes.  Social History: Last updated: 12/25/2008 No tobacco No alcohol No illicit drugs Single, 2 children  Past medical, surgical, family and social histories (including risk factors) reviewed, and no changes noted (except as noted below).  Past Medical History: Reviewed history from 05/11/2009 and no changes required. 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 ROTATOR CUFF REPAIR, LEFT, HX OF (ICD-V45.89)  Family History: Reviewed history from 11/09/2008 and no changes required. Mother:hx of general tonic-clonic seizure.  Family hx  of stroke, hypertension, diabetes.  Social History: Reviewed history from 12/25/2008 and no changes required. No tobacco No alcohol No illicit drugs Single, 2 children  Vital Signs:  Patient profile:   30 year old female Height:      64 inches Weight:      192 pounds Pulse rate:   88 / minute Pulse (ortho):   82 / minute Pulse rhythm:   regular BP sitting:   140 / 89  (left arm) BP standing:   136 / 88 Cuff size:   large  Vitals Entered By: Judithe Modest CMA (Dec 21, 2009 9:51 AM)  Serial Vital Signs/Assessments:  Time      Position  BP       Pulse  Resp  Temp     By o min     Lying LA  130/83   84                    Burnett Kanaris, CNA o min     Sitting   132/87   79                    Burnett Kanaris, CNA o min     Standing  136/88   82                    Burnett Kanaris, CNA 2 min     Standing  142/90   77  Burnett Kanaris, CNA 3 min     Standing  134/88   84                    Burnett Kanaris, CNA  Comments: 2 min no dizziness  By: Burnett Kanaris, CNA  3 min no dizziness By: Burnett Kanaris, CNA    Physical Exam  Additional Exam:  Patient is in NAD HEENT:  Normocephalic, atraumatic. EOMI, PERRLA.  Neck: JVP is normal. No thyromegaly. No bruits.  Lungs: clear to auscultation. No rales no wheezes.  Heart: Regular rate and rhythm. Normal S1, S2. No S3.   Gr I-II/VI systlolic murmur LUSB.  PMI not displaced.  Abdomen:  Supple, nontender. Normal bowel sounds. No masses. No hepatomegaly.  Extremities:   Good distal pulses throughout. No lower extremity edema.  Musculoskeletal :moving all extremities.  Neuro:   alert and oriented x3.    Impression & Recommendations:  Problem # 1:  DIZZINESS (ICD-780.4) Seems to have resolved.  Patient had significant problems, though was not objectified on tilt table. She is doing much better.  She is on Pindolol which may be helping.  I have asked her to continue. I would not take Lasix.  I would recommend f/u in 1  year.  she should call if her symtoms worsen.  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  No signifcant PI.  F/U clinically in 1 year.

## 2010-09-17 NOTE — Progress Notes (Signed)
  Paperwork recieved from [employer] sent to [employer]. [facility] [place]  May 07, 2010 4:28 PM

## 2010-09-17 NOTE — Letter (Signed)
Summary: [employer]  [employer]   Imported By: Marylou Mccoy 08/28/2009 15:35:14  _____________________________________________________________________  External Attachment:    Type:   Image     Comment:   External Document

## 2010-09-17 NOTE — Progress Notes (Signed)
Summary: Pt having passing out spells**LMTCB**  Phone Note Call from Patient Call back at Home Phone [phone]   Caller: Patient Summary of Call: Pt having passing out spells Initial call taken by: Judie Grieve,  April 03, 2010 2:35 PM  Follow-up for Phone Call        Left message to call back. Julieta Gutting, RN, BSN  April 03, 2010 2:53 PM  Additional Follow-up for Phone Call Additional follow up Details #1::        Two wks  ago had dizzy spells. Has had 2 syncopal spells.  1 Thurs (stomach pain, then syncope 8/11;  Saturdaay 8/20   Washing cloths  HA, dizzy, hearing fading, syncope  Occurred 1 :30.  Claims drinking water.  Currnetly better.   Rec pushing salt  and fluid.  Call if recurrs. Additional Follow-up by: Sherrill Raring, MD, [facility],  April 08, 2010 1:02 PM

## 2010-09-19 NOTE — Letter (Signed)
Summary: [facility]   [facility]   Imported By: Roderic Ovens 07/30/2010 15:54:56  _____________________________________________________________________  External Attachment:    Type:   Image     Comment:   External Document

## 2010-09-19 NOTE — Letter (Signed)
Summary: Appointment - Reschedule  [facility], Main Office  [address]   [place], [place] [address]   Phone: [phone]  Fax: [phone]     September 04, 2010 MRN: [id number]   Brandy Carpenter [address] [place], [place]  [address]   Dear [name],   Due to a change in our office schedule, your appointment on 10/04/10 at 10:67must be changed.  It is very important that we reach you to reschedule this appointment. We look forward to participating in your health care needs. Please contact us at the number listed above at your earliest convenience to reschedule this appointment.     Sincerely, Control and instrumentation engineer

## 2010-10-04 ENCOUNTER — Encounter: Payer: Self-pay | Admitting: Internal Medicine

## 2010-10-04 ENCOUNTER — Ambulatory Visit (INDEPENDENT_AMBULATORY_CARE_PROVIDER_SITE_OTHER): Payer: Medicaid Other | Admitting: Internal Medicine

## 2010-10-04 DIAGNOSIS — I379 Nonrheumatic pulmonary valve disorder, unspecified: Secondary | ICD-10-CM

## 2010-10-04 DIAGNOSIS — R42 Dizziness and giddiness: Secondary | ICD-10-CM

## 2010-10-04 DIAGNOSIS — I10 Essential (primary) hypertension: Secondary | ICD-10-CM

## 2010-10-08 ENCOUNTER — Encounter: Payer: Self-pay | Admitting: Internal Medicine

## 2010-10-08 ENCOUNTER — Telehealth: Payer: Self-pay | Admitting: Internal Medicine

## 2010-10-09 ENCOUNTER — Telehealth: Payer: Self-pay | Admitting: Internal Medicine

## 2010-10-15 NOTE — Progress Notes (Signed)
Summary: blood pressure and meds  Phone Note Call from Patient Call back at Home Phone [phone] Call back at [phone]   Caller: Patient Reason for Call: Talk to Nurse Summary of Call: pt blood pressure today 157/92. pt states she been taking her blood pressure all weekend its been the same since friday 10/04/10. pt also has question re her meds.  Initial call taken by: Roe Coombs,  October 08, 2010 10:56 AM  Follow-up for Phone Call        Called patient back. She took lasix33meq and Kcl 20mg  on Sat,Sunday,and Monday. Her BP was 147/76 yesterday and 157/92 today. She had an increased urine output the last 3 days but feels that it was not that much more than usual. Pateint states that she still has lower extremity edema. Advised her that I will discuss above with Dr.Praveen Coia and call her back.  Layne Benton, RN, BSN  October 08, 2010 11:30 AM      Additional Follow-up for Phone Call Additional follow up Details #1::        [facility] [phone] lasix 40 mg 1x per day kcl 20 meq 1x per day Additional Follow-up by: Sherrill Raring, MD, [facility],  October 08, 2010 4:30 PM     Appended Document: blood pressure and meds Reviewed instructions with patient. She will take Lasix 40 and KCL 20 on 2/22 and let us know how she feels on 2/23.

## 2010-10-15 NOTE — Assessment & Plan Note (Signed)
Summary: F4M RS FROM BUMPLIST=MJ   Visit Type:  rov Primary Provider:  [facility]  CC:  dizziness when sitting up and or standing for long periods of time.  History of Present Illness: patient is a 30 year old with a history of mild pulmonic stenosis, syncope. I saw her in the fall. At that time she was in the middle of her pregnancy.  Having problem of continued dizziness.   Since I saw her she had the baby 1 month ago.  Followed at [facility] (saw multiple doctors; did see a Vernona Rieger (question nurse) often).  She says during the later part of her pregnancy her BP was elevated into the 170s.  Did not get put on meds for this.  Delivery was long (12 hours) as baby was reported stuck in birth canal Since delivery has continued to have dizziness.  No syncope, though comes close.  Breast feeding.  Due back in OB clinic in a couple wks.  Does note LE edema with wt gain.   Current Medications (verified): 1)  Pre Natal Vitamins .... One Tab Once Daily  Allergies (verified): No Known Drug Allergies  Past History:  Past medical, surgical, family and social histories (including risk factors) reviewed, and no changes noted (except as noted below).  Past Medical History: Reviewed history from 05/11/2009 and no changes required. 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 ROTATOR CUFF REPAIR, LEFT, HX OF (ICD-V45.89)  Family History: Reviewed history from 11/09/2008 and no changes required. Mother:hx of general tonic-clonic seizure.  Family hx of stroke, hypertension, diabetes.  Social History: Reviewed history from 12/25/2008 and no changes required. No tobacco No alcohol No illicit drugs Single, 2 children  Review of Systems       Reviewed.  Alll systems negative except as noted abvoe.  Vital  Signs:  Patient profile:   30 year old female Height:      64 inches Weight:      199.50 pounds BMI:     34.37 Pulse rate:   89 / minute BP supine:   151 / 97  (left arm) BP sitting:   151 / 102  (left arm) BP standing:   149 / 97  (left arm) Cuff size:   regular  Vitals Entered By: Caralee Ates CMA (October 04, 2010 11:06 AM)  Physical Exam  Additional Exam:  Patient is in NAD HEENT:  Normocephalic, atraumatic. EOMI, PERRLA.  Neck: JVP is normal. No thyromegaly. No bruits.  Lungs: clear to auscultation. No rales no wheezes.  Heart: Regular rate and rhythm. Normal S1, S2. No S3.   No significant murmurs. PMI not displaced.  Abdomen:  Supple,slightly distended. nontender. Normal bowel sounds. No masses. No hepatomegaly.  Extremities:   Good distal pulses throughout. Tr to 1+  lower extremity edema.  Musculoskeletal :moving all extremities.  Neuro:   alert and oriented x3.    Impression & Recommendations:  Problem # 1:  ESSENTIAL HYPERTENSION, BENIGN (ICD-401.1) BP is signif increased.  I have not seen it like this before. She has some excess fluid on exam.  I would recommend a few days of Lasix 20.  (throw out pumped milk while taking lasix) Call with BP readings on Monday.  Will consider Labatelol. based on BP.  Problem # 2:  DIZZINESS (ICD-780.4) Again, will need to follow.  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  No signif mumur on exam.  Patient Instructions: 1)  Call us with your BP readings on Monday 10/07/2010 Prescriptions: POTASSIUM CHLORIDE CR 10 MEQ CR-CAPS (POTASSIUM CHLORIDE) 2 caps in the am times 3 days  #6 x 1   Entered by:   Layne Benton, RN, BSN   Authorized by:   Sherrill Raring, MD, [facility]   Signed by:   Layne Benton, RN, BSN on 10/04/2010   Method used:   Electronically to        [facility]  [address] [id number] ([facility]ail)       [address]       [place], [place]  [address]       Ph: [phone]  or [phone]       Fax: [phone]   RxID:   [id number] FUROSEMIDE 20 MG TABS (FUROSEMIDE) 1 every am times 3 days  #3 x 1   Entered by:   Layne Benton, RN, BSN   Authorized by:   Sherrill Raring, MD, [facility]   Signed by:   Layne Benton, RN, BSN on 10/04/2010   Method used:   [facility]ically to        [facility]  [address] [id number] ([facility]ail[facility]       [address]       [place], [place]  [address]       Ph: [phone] or [phone]       Fax: [phone]   RxID:   [id number]

## 2010-10-15 NOTE — Progress Notes (Addendum)
Summary: pt calling to give b/p reading  Phone Note Call from Patient Call back at Home Phone [phone]   Caller: Patient Reason for Call: Talk to Nurse, Talk to Doctor Summary of Call: bp readings for today @ 12noon 170/84    Initial call taken by: Omer Jack,  October 09, 2010 3:01 PM  Follow-up for Phone Call        Pt calls with bp 170/84.  She has not picked up lasix or potassium  but will today. Her swelling is "about the same".  she does feel tired and " has a lot going on at home".  She will call back 2/23 with bp. Follow-up by: Lisabeth Devoid RN,  October 09, 2010 3:25 PM     Appended Document: pt calling to give b/p reading Spoke with patient.  Swelling is improved but not gone BP 160-170/80 Recommend:  F/U on this Thurs with clinic visit.  WOuld check BMET at that time

## 2010-10-15 NOTE — Miscellaneous (Addendum)
  Clinical Lists Changes  Medications: Added new medication of FUROSEMIDE 40 MG TABS (FUROSEMIDE) 1 tab every am - Signed Changed medication from POTASSIUM CHLORIDE CR 10 MEQ CR-CAPS (POTASSIUM CHLORIDE) 2 caps in the am times 3 days to POTASSIUM CHLORIDE CR 10 MEQ CR-CAPS (POTASSIUM CHLORIDE) 2 caps in the am - Signed Rx of FUROSEMIDE 40 MG TABS (FUROSEMIDE) 1 tab every am;  #14 x 0;  Signed;  Entered by: Layne Benton, RN, BSN;  Authorized by: Sherrill Raring, MD, [facility];  Method used: Electronically to [facility] [phone]*, [address] [place], [place]  [address], Ph: [phone], Fax:  Rx of POTASSIUM CHLORIDE CR 10 MEQ CR-CAPS (POTASSIUM CHLORIDE) 2 caps in the am;  #28 x 0;  Signed;  Entered by: Layne Benton, RN, BSN;  Authorized by: Sherrill Raring, MD, [facility];  Method used: Electronically to [facility] [phone]*, [address], [place], [place]  [address], Ph: [phone], Fax:    Prescriptions: POTASSIUM CHLORIDE CR 10 MEQ CR-CAPS (POTASSIUM CHLORIDE) 2 caps in the am  #28 x 0   Entered by:   Layne Benton, RN, BSN   Authorized by:   Sherrill Raring, MD, [facility]   Signed by:   Layne Benton, RN, BSN on 10/08/2010   Method used:   Electronically to        [facility] [phone]* (retail)       [address]       [place], [place]  [address]       Ph: [phone]       Fax:    RxID:   [id number] FUROSEMIDE 40 MG TABS (FUROSEMIDE) 1 tab every am  #14 x 0   Entered by:   Layne Benton, RN, BSN   Authorized by:   Sherrill Raring, MD, [facility]   Signed by:   Layne Benton, RN, BSN on 10/08/2010   Method used:   Electronically to        [facility] [phone]* (retail)       [address]       [place], [place]  [address]       Ph: [phone]       Fax:    RxID:   [id number]

## 2010-10-17 ENCOUNTER — Other Ambulatory Visit: Payer: Self-pay | Admitting: Internal Medicine

## 2010-10-17 ENCOUNTER — Ambulatory Visit (INDEPENDENT_AMBULATORY_CARE_PROVIDER_SITE_OTHER): Payer: Medicaid Other | Admitting: Internal Medicine

## 2010-10-17 ENCOUNTER — Encounter: Payer: Self-pay | Admitting: Internal Medicine

## 2010-10-17 DIAGNOSIS — R42 Dizziness and giddiness: Secondary | ICD-10-CM

## 2010-10-17 DIAGNOSIS — R002 Palpitations: Secondary | ICD-10-CM

## 2010-10-17 DIAGNOSIS — I110 Hypertensive heart disease with heart failure: Secondary | ICD-10-CM | POA: Insufficient documentation

## 2010-10-17 LAB — HEPATIC FUNCTION PANEL
ALT: 27 U/L (ref 0–35)
AST: 26 U/L (ref 0–37)
Bilirubin, Direct: 0.1 mg/dL (ref 0.0–0.3)
Total Bilirubin: 0.6 mg/dL (ref 0.3–1.2)

## 2010-10-17 LAB — CBC WITH DIFFERENTIAL/PLATELET
Basophils Relative: 0.4 % (ref 0.0–3.0)
Eosinophils Relative: 2 % (ref 0.0–5.0)
Lymphocytes Relative: 43.9 % (ref 12.0–46.0)
Monocytes Relative: 8.4 % (ref 3.0–12.0)
Neutrophils Relative %: 45.3 % (ref 43.0–77.0)
RBC: 4.94 Mil/uL (ref 3.87–5.11)
WBC: 5.8 10*3/uL (ref 4.5–10.5)

## 2010-10-17 LAB — BASIC METABOLIC PANEL
CO2: 28 mEq/L (ref 19–32)
Calcium: 9.2 mg/dL (ref 8.4–10.5)
GFR: 128.09 mL/min (ref >60.00)
Sodium: 136 mEq/L (ref 135–145)

## 2010-10-17 LAB — BRAIN NATRIURETIC PEPTIDE: Pro B Natriuretic peptide (BNP): 7.4 pg/mL (ref 0.0–100.0)

## 2010-10-21 ENCOUNTER — Encounter: Payer: Self-pay | Admitting: Internal Medicine

## 2010-10-29 NOTE — Miscellaneous (Signed)
  Clinical Lists Changes  Medications: Removed medication of POTASSIUM CHLORIDE CR 10 MEQ CR-CAPS (POTASSIUM CHLORIDE) 2 caps in the am Removed medication of FUROSEMIDE 40 MG TABS (FUROSEMIDE) 1 tab every am Added new medication of LABETALOL HCL 200 MG TABS (LABETALOL HCL) 1 tab 2 times per day - Signed Rx of LABETALOL HCL 200 MG TABS (LABETALOL HCL) 1 tab 2 times per day;  #60 x 6;  Signed;  Entered by: Layne Benton, RN, BSN;  Authorized by: Sherrill Raring, MD, [facility];  Method used: Electronically to [facility] [phone]*, [address], [place], [place]  [address], Ph: [phone], Fax:    Prescriptions: LABETALOL HCL 200 MG TABS (LABETALOL HCL) 1 tab 2 times per day  #60 x 6   Entered by:   Layne Benton, RN, BSN   Authorized by:   Sherrill Raring, MD, [facility]   Signed by:   Layne Benton, RN, BSN on 10/21/2010   Method used:   Electronically to        [facility] [phone]* (retail)       [address]       [place], [place]  [address]       Ph: [phone]       Fax:    RxID:   [id number]

## 2010-10-29 NOTE — Assessment & Plan Note (Signed)
Summary: appt 12pm per  dr Tenny Craw call 10/15/10 @ 443pm=mj   Primary Provider:  [facility]  CC:  pt states she is doing okay. She is tired and dizzy.  Marland Kitchen  History of Present Illness: patient is a 30 year old with a history of mild pulmonic stenosis, syncope.  I just saw her in clinic .  She is postpartum and has had dizziness, LE swelling.  She has also ben very hypertensive. When I saw her I put her on Lasix 20 with KCL.   She did not urinate much with this.  I increased it to Lasix 40 with 20 KCL.  htis has helped some but her legs are still a little swollen.  Still with swelling   Still dizzy.  It has become her norm.  No syncope.  BP at home 160s/ systolic.  Current Medications (verified): 1)  Pre Natal Vitamins .... One Tab Once Daily 2)  Potassium Chloride Cr 10 Meq Cr-Caps (Potassium Chloride) .... 2 Caps in The Am 3)  Furosemide 40 Mg Tabs (Furosemide) .Marland Kitchen.. 1 Tab Every Am  Allergies (verified): No Known Drug Allergies  Past History:  Past medical, surgical, family and social histories (including risk factors) reviewed, and no changes noted (except as noted below).  Past Medical History: Reviewed history from 05/11/2009 and no changes required. 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 ROTATOR CUFF REPAIR, LEFT, HX OF (ICD-V45.89)  Family History: Reviewed history from 11/09/2008 and no changes required. Mother:hx of general tonic-clonic seizure.  Family hx of stroke, hypertension, diabetes.  Social History: Reviewed history from 12/25/2008 and no changes required. No tobacco No alcohol No illicit drugs Single, 2 children  Review of Systems       Reviewed.  Neg to the abovep problem except as noted above.  Vital Signs:  Patient profile:   30 year old female Height:      64 inches Weight:      192  pounds BMI:     33.08 Pulse rate:   74 / minute Pulse rhythm:   regular BP supine:   140 / 91  (left arm) BP sitting:   146 / 100  (left arm) BP standing:   153 / 102  (left arm) Cuff size:   regular  Vitals Entered By: Judithe Modest CMA (October 17, 2010 1:07 PM)  Serial Vital Signs/Assessments:  Comments: 1:18 PM 2 min 153/111  HR 87 3 min 150/106 HR 89 By: Judithe Modest CMA    Physical Exam  Additional Exam:  Patient is in NAD HEENT:  Normocephalic, atraumatic. EOMI, PERRLA.  Neck: JVP is normal. No thyromegaly. No bruits.  Lungs: clear to auscultation. No rales no wheezes.  Heart: Regular rate and rhythm. Normal S1, S2. No S3.   No significant murmurs. PMI not displaced.  Abdomen:  Supple, nontender. Normal bowel sounds. No masses. No hepatomegaly.  Extremities:   Good distal pulses throughout. Tr  lower extremity edema.  Musculoskeletal :moving all extremities.  Neuro:   alert and oriented x3.    Impression & Recommendations:  Problem # 1:  HYPERTENSION, HEART UNCONTROLLED W/ CHF (ICD-402.01) BP is still high.  Her legs are better .  I would recomm checking labs today to decide what to due with Lasix (she is throwing out the pumped milk).   The following medications were removed from the medication list:  Furosemide 20 Mg Tabs (Furosemide) .Marland Kitchen... 1 every am times 3 days Her updated medication list for this problem includes:    Furosemide 40 Mg Tabs (Furosemide) .Marland Kitchen... 1 tab every am  Problem # 2:  DIZZINESS (ICD-780.4) Persists but no syncope.  Check labs today. 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 The following medications were removed from the medication list:    Furosemide 20 Mg Tabs (Furosemide) .Marland Kitchen... 1 every am  times 3 days Her updated medication list for this problem includes:    Furosemide 40 Mg Tabs (Furosemide) .Marland Kitchen... 1 tab every am  Other Orders: TLB-TSH (Thyroid Stimulating Hormone) (84443-TSH)

## 2010-10-30 ENCOUNTER — Telehealth: Payer: Self-pay | Admitting: Internal Medicine

## 2010-11-01 LAB — CBC
MCH: 26.4 pg (ref 26.0–34.0)
MCHC: 32.7 g/dL (ref 30.0–36.0)
Platelets: 192 10*3/uL (ref 150–400)

## 2010-11-01 LAB — URINALYSIS, ROUTINE W REFLEX MICROSCOPIC
Bilirubin Urine: NEGATIVE
Glucose, UA: NEGATIVE mg/dL
Hgb urine dipstick: NEGATIVE
Ketones, ur: 40 mg/dL — AB
Nitrite: NEGATIVE
Protein, ur: NEGATIVE mg/dL
Specific Gravity, Urine: 1.02 (ref 1.005–1.030)
Urobilinogen, UA: 1 mg/dL (ref 0.0–1.0)
pH: 7 (ref 5.0–8.0)

## 2010-11-01 LAB — COMPREHENSIVE METABOLIC PANEL
AST: 16 U/L (ref 0–37)
Albumin: 3.4 g/dL — ABNORMAL LOW (ref 3.5–5.2)
Calcium: 8.9 mg/dL (ref 8.4–10.5)
Creatinine, Ser: 0.45 mg/dL (ref 0.4–1.2)
GFR calc Af Amer: >60 mL/min (ref >60)
GFR calc non Af Amer: >60 mL/min (ref >60)

## 2010-11-01 LAB — URINE MICROSCOPIC-ADD ON

## 2010-11-01 LAB — URINE CULTURE: Colony Count: >=100000

## 2010-11-03 LAB — URINALYSIS, ROUTINE W REFLEX MICROSCOPIC
Bilirubin Urine: NEGATIVE
Ketones, ur: NEGATIVE mg/dL
Nitrite: NEGATIVE
pH: 6.5 (ref 5.0–8.0)

## 2010-11-03 LAB — CBC
Hemoglobin: 12 g/dL (ref 12.0–15.0)
MCHC: 32.5 g/dL (ref 30.0–36.0)
RBC: 4.6 MIL/uL (ref 3.87–5.11)
WBC: 5 10*3/uL (ref 4.0–10.5)

## 2010-11-03 LAB — ABO/RH: ABO/RH(D): O POS

## 2010-11-03 LAB — URINE MICROSCOPIC-ADD ON

## 2010-11-05 NOTE — Progress Notes (Signed)
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 Will forward to Dr. Tenny Craw Follow-up by: Lisabeth Devoid RN,  October 30, 2010 12:02 PM  Additional Follow-up for Phone Call Additional follow up Details #1::        [employer] paperwork was completed on 10/24/2010 and turned into medical records. Elease Hashimoto from [facility] advised me that she does not have the paperwork. Apolinar Junes from medical records will look for [employer] paperwork.     Layne Benton, RN, BSN  October 31, 2010 12:24 PM

## 2010-11-08 ENCOUNTER — Telehealth: Payer: Self-pay | Admitting: Internal Medicine

## 2010-11-08 NOTE — Telephone Encounter (Signed)
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

## 2010-11-11 ENCOUNTER — Encounter: Payer: Self-pay | Admitting: Internal Medicine

## 2010-12-02 LAB — CBC
HCT: 35.4 % — ABNORMAL LOW (ref 36.0–46.0)
HCT: 36.8 % (ref 36.0–46.0)
HCT: 39.2 % (ref 36.0–46.0)
Hemoglobin: 11.7 g/dL — ABNORMAL LOW (ref 12.0–15.0)
Hemoglobin: 12.7 g/dL (ref 12.0–15.0)
MCHC: 32.2 g/dL (ref 30.0–36.0)
MCHC: 32.4 g/dL (ref 30.0–36.0)
MCV: 84.3 fL (ref 78.0–100.0)
MCV: 84.6 fL (ref 78.0–100.0)
MCV: 85.2 fL (ref 78.0–100.0)
Platelets: 141 10*3/uL — ABNORMAL LOW (ref 150–400)
Platelets: 150 10*3/uL (ref 150–400)
RBC: 4.2 MIL/uL (ref 3.87–5.11)
RBC: 4.63 MIL/uL (ref 3.87–5.11)
RDW: 16 % — ABNORMAL HIGH (ref 11.5–15.5)
RDW: 16.1 % — ABNORMAL HIGH (ref 11.5–15.5)
WBC: 7.4 10*3/uL (ref 4.0–10.5)

## 2010-12-03 NOTE — Telephone Encounter (Signed)
Dr. Tenny Craw recommended for pt. To come for O/V with Tereso Newcomer PA on Friday 12/06/10, MD will be  in the office that day.  An appointment was made for pt. with the PA at 11:30 AM pt. Aware.

## 2010-12-03 NOTE — Telephone Encounter (Signed)
Pt needs to talk to [facility] re her blood pressure pt only wants to talk to a nurse.

## 2010-12-03 NOTE — Telephone Encounter (Signed)
Pt. States she had a syncope episode yesterday. She was out for a while because her 28 years old son was scare when she come to. EMS took pt to ER because her blood pressure was high 157/100. The ER doctor did recommend for pt. to take her B/P medication and to have a F/U visit with Dr. Tenny Craw MD. Today's B/P is 160/98. Pt. Was offered an appointment with the PA on Friday Am, it is the only available date. Pt. Asked me to call . Dr. Tenny Craw to seen what she recommends. Dr. Tenny Craw paged, awaiting her response.

## 2010-12-06 ENCOUNTER — Ambulatory Visit: Payer: Medicaid Other | Admitting: Physician Assistant

## 2010-12-09 ENCOUNTER — Ambulatory Visit (INDEPENDENT_AMBULATORY_CARE_PROVIDER_SITE_OTHER): Payer: Medicaid Other | Admitting: Physician Assistant

## 2010-12-09 ENCOUNTER — Encounter: Payer: Self-pay | Admitting: Physician Assistant

## 2010-12-09 VITALS — BP 145/98 | HR 82 | Ht 64.0 in | Wt 203.4 lb

## 2010-12-09 DIAGNOSIS — R55 Syncope and collapse: Secondary | ICD-10-CM

## 2010-12-09 DIAGNOSIS — I10 Essential (primary) hypertension: Secondary | ICD-10-CM

## 2010-12-09 MED ORDER — PINDOLOL 5 MG PO TABS: 5.0000 mg | ORAL_TABLET | Freq: Two times a day (BID) | ORAL | Status: DC

## 2010-12-09 MED ORDER — FUROSEMIDE 20 MG PO TABS: ORAL_TABLET | ORAL | Status: AC

## 2010-12-09 NOTE — Assessment & Plan Note (Signed)
Recurrent.  She has had a negative workup in the past.  She is at risk for this given her autonomic dysfunction.  Orthostatics are normal.  She will need to pay close attention to fluid intake.  No additional workup at this time.  Patient also seen by Dr. Tenny Craw today.

## 2010-12-09 NOTE — Patient Instructions (Addendum)
Your physician has recommended you make the following change in your medication: STOP TAKING LABETALOL  Your physician has recommended you make the following change in your medication: START PINDOLOL 5 MG TAKE AS DIRECTED AS PER DR. PAULA ROSS. START LASIX 20 MG 1 TABLET DAILY ONLY WHEN YOU HAVE SWELLING , IF YOU START TAKING THIS ON A DAILY BASIS CALL us AND LET us KNOW SINCE YOU WILL HAVE TO HAVE BLOOD WORK (BMET).   Your physician recommends that you schedule a follow-up appointment in: 3 months with DR. PAULA ROSS

## 2010-12-09 NOTE — Progress Notes (Signed)
History of Present Illness: Primary Cardiologist:  Dr. Dietrich Pates  Brandy Carpenter is a 30 y.o. female with a h/o syncope 2/2 autonomic dysfunction, HTN and edema.  She has a h/o pulmonic stenosis.  Follow up echo 9/11 demonstrated normal LVF and no evidence of PI or PS.  She had a negative tilt table test in 2010.  She is 3 months post-partum.  She has had a tubal ligation.  She presents with a recent episode of syncope one week ago.  She awoke and went to the bathroom at 7am.  She had no prodrome.  She did hit her head.  She has no indication how long she was unconscious.  She does note feeling confused upon awakening.  She went to the ED at [facility].  She had no head CT.  She received IVFs and was sent home.  She has not had any further episodes.  No chest pain or dyspnea.  She does note problems with edema.  She is no longer on lasix.  She feels "jittery" when she takes the Labetalol.  Past Medical History  Diagnosis Date  . Pulmonic stenosis     a. last echo 9/11: normal LVF, no evidence of PI or PS  . Syncope     Tilt neg. in 2010; likely autonomic dysfunction  . Arnold-Chiari malformation, type I     Dr. Terrace Arabia at [facility]  . HTN (hypertension)   . Edema    Past Surgical History  Procedure Date  . Tubal ligation      Current Outpatient Prescriptions  Medication Sig Dispense Refill  . labetalol (NORMODYNE) 200 MG tablet Take 200 mg by mouth 2 (two) times daily.          No Known Allergies   ROS:  Please see HPI.  No fevers, chills, melena or hematochezia.  She has a lot of night sweats.    Vital Signs: BP 145/98  Pulse 82  Ht 5\' 4"  (1.626 m)  Wt 203 lb 6.4 oz (92.262 kg)  BMI 34.91 kg/m2  PHYSICAL EXAM: Well nourished, well developed, in no acute distress HEENT: normal Neck: no JVD Endocrine: no thyromegaly Cardiac:  normal S1, S2; RRR; no murmur Lungs:  clear to auscultation bilaterally, no wheezing, rhonchi or rales Abd: soft, nontender, no hepatomegaly Ext: no  edema Skin: warm and dry Psych: normal affect Neuro:  CNs 2-12 intact, no focal abnormalities noted  ASSESSMENT AND PLAN:

## 2010-12-09 NOTE — Assessment & Plan Note (Signed)
Uncontrolled.  She is not tolerating the labetalol.  As noted, she was seen by Dr. Tenny Craw.  The patient did better on pindolol.  Will switch her back to pindolol 5 mg 1/2 bid.  She will follow up with Dr. Tenny Craw in 3 mos.  She will be given lasix 20 mg to take QD prn.  If she takes it regularly, she will need a BMET.

## 2010-12-31 NOTE — Assessment & Plan Note (Signed)
[facility]                            CARDIOLOGY OFFICE NOTE   Brandy Carpenter, Brandy Carpenter                       MRN:          [id number]  DATE:09/25/2008                            DOB:          [birth year]    IDENTIFICATION:  [name] is a 30 year old woman who I have followed in  Cardiology Clinic.  Her birthday is [birth year] actually.  I last saw her  back in November.   The patient just had her baby on January 12.  Again, her pregnancy was  difficult because of dizziness, intermittent syncope (felt secondary to  autonomic dysfunction).   Since having the baby, she is not having spells as frequently.  She has  had a couple of syncopal spells at home with not a lot of warning.  She  will feel lightheaded and then she passed out.  She is eager to feel  better again.  Her current medicines include prenatal vitamin daily.   PHYSICAL EXAM:  GENERAL:  The patient with a headache.  VITAL SIGNS:  Blood pressure laying 116/86, sitting; blood pressure  133/90, pulse 72; standing, 130/85, pulse 77 (0 minutes), at 2 minutes,  131/93, pulse 86, and at 5 minutes 129/87, pulse 83.  LUNGS:  Clear.  CARDIAC EXAM:  Regular rate and rhythm.  S1, S2.  No murmurs.  ABDOMEN:  Benign, distended.  EXTREMITIES:  No edema.   IMPRESSION:  Syncope.  Again, the patient has had a longstanding history  of autonomic dysfunction, mild usually, but with this pregnancy, has  been pronounced, she still is having spells, I think her body is  beginning to regroup from the pregnancy, and this is not unexpected.  I  told her to give this a little more time, continue to increase her fluid  and salt intake as she is doing, also asked her to wear some sort of  abdominal binder/girdle.  Elevate the head of her bed.   I will be in touch with pharmacy regarding magnesium oxide for  headaches.  We also need to be in touch with her regarding followup.  Again, on examination today, she shows no  evidence of significant  orthostasis, with only a mild increase in her heart rate.     Pricilla Riffle, MD, [facility]  Electronically Signed    PVR/MedQ  DD: 09/25/2008  DT: 09/26/2008  Job #: [id number]   cc:   Vergia Alberts

## 2010-12-31 NOTE — Assessment & Plan Note (Signed)
[facility]                            CARDIOLOGY OFFICE NOTE   ERLINDA, SOLINGER                       MRN:          [id number]  DATE:07/21/2007                            DOB:          08-05-1981    This is a patient of Dr. Dietrich Pates and Dr. Renaye Rakers, primary care  physician.   Ms. Stetler is a 30 year old African-American female, who saw Dr. Tenny Craw back  in 2005 because of a history of trivalvular pulmonary stenosis and  pregnancy.  Dr. Tenny Craw did not order any tests and was trying to obtain an  echo done by Dr. Clelia Croft, but the charts do not show that we ever received  a copy of that.   The patient has never had any trouble from her pulmonary stenosis.  She  now complains of a year-and-a-half history of palpitations that have  progressively worsened.  They occur one to two times a week and last 10-  15 minutes.  She describes the episodes as her heart begins to race, she  becomes sweaty, dizzy, short of breath and says she feels like she is  having a heart attack.  If she slows her breathing down and takes deep  breaths, it abates within 10-15 minutes.  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  Twice, while it happened at work,  they called EMS, who told her she was having a panic attack and showed  her how to slow her breathing.  She has no history of anxiety and feels  like her heart is truly racing.  She has not had any syncope with this.  She says it usually happens when she is in a rush or a hurry, or she is  on a very stressful route, driving her bus, and she has a lot of  tension.  She runs about a mile two days a week and has no problems when  she runs.  She denies any problems when she is at rest or lying down.   ALLERGIES:  No known drug allergies.   MEDICATIONS:  Vitamin C 500 mg daily.   PAST MEDICAL HISTORY:  Significant for pulmonary stenosis, as stated  above.  Left rotator cuff surgery in 2001.  Prior  history of anemia.  Recent history of headaches.   FAMILY HISTORY:  Negative.  No cardiac history.  Her mother and sister  both have thyroid problems.   SOCIAL HISTORY:  She is single.  She has a two and a half-year-old son  and she lives with her parents and drives a bus for [employer].  She does not  smoke, drink alcohol or use or abuse drugs.   REVIEW OF SYSTEMS:  Significant for dizziness with these episodes.  No  chest pain, dyspnea on exertion, nausea or vomiting, change in bowels or  melena.  She does complain of significant headaches, worsened by the sun  or light or noise.  She has had about a 40-pound weight-gain in the past  five months that she is not sure why.   PHYSICAL EXAM:  This is a pleasant, 30 year old African-American female,  in no acute distress.  Blood pressure 121/81, pulse 82, weight 171.  NECK:  Without JVD, HJR, bruit or thyroid enlargement.  LUNGS:  Clear, anterior, posterior and lateral.  HEART:  Regular rate and rhythm at 80 beats per minute.  Normal S1 and  S2.  There is a click and a 1-2/6 systolic murmur at the left sternal  border and base.  ABDOMEN:  Soft, without organomegaly, masses, lesions or abnormal  tenderness.  EXTREMITIES:  Without cyanosis, clubbing or edema.  She has good distal  pulses.   EKG:  Normal sinus rhythm, normal EKG.   IMPRESSION:  1. Palpitations with dizziness, rule out arrhythmia.  2. History of trivalvular pulmonic stenosis.  3. Forty-pound weight-gain.  4. History of anemia.   PLAN AT THIS TIME:  I will order a 2D echo, since she has not had one  done in many years, and, with her history of pulmonic stenosis, we will  also order an event recorder to try to capture these events and see if  she has an arrhythmia going on.  I will also order blood work to rule  out hypothyroidism or any other secondary cause of arrhythmia.  She will  see Dr. Tenny Craw back in three to four weeks after we receive the results of  these  tests.      Jacolyn Reedy, PA-C  Electronically Signed      Madolyn Frieze. Jens Som, MD, [facility]  Electronically Signed   ML/MedQ  DD: 07/21/2007  DT: 07/21/2007  Job #: [id number]   cc:   Renaye Rakers, M.D.

## 2010-12-31 NOTE — Discharge Summary (Signed)
Brandy Carpenter, Brandy Carpenter                ACCOUNT NO.:  [id number]   MEDICAL RECORD NO.:  [id number]          PATIENT TYPE:  INP   LOCATION:  9161                          FACILITY:  [facility]   PHYSICIAN:  Gerrit Friends. Aldona Bar, M.D.   DATE OF BIRTH:  May 24, 1981   DATE OF ADMISSION:  07/30/2008  DATE OF DISCHARGE:  07/31/2008                               DISCHARGE SUMMARY   DISCHARGE DIAGNOSES:  1. A 33-week pregnancy, undelivered.  2. Preterm contractions, arrested.   SUMMARY:  This 30 year old gravida 2, para 1 was admitted by Dr. Edward Jolly  on July 30, 2008/July 31, 2008, after she presented the second  time to maternity admissions for contractions.  Her evaluation consisted  of a negative fetal fibronectin, negative urinalysis, but ultimately she  required admission for observation.  She was placed on Procardia 10 mg  every 8 hours orally.  When seen by me on the morning of July 31, 2008, she was improved and again when seen in the afternoon of July 31, 2008, she was very much improved having no contractions with a  reactive fetal heart tracing.   The patient was discharged to home with appropriate instruction.  A  prescription for Procardia 10 mg to use every 8 hours to prevent  contractions and Zofran 4 mg every 4-6 hours as needed for nausea and  vomiting.  She has an appointment in the office in approximately 1  week's time for followup and will be seen then or sooner if needed.  She  was given all of her instructions at the time of discharge and  understood all instructions well.   CONDITION ON DISCHARGE:  Improved.        Gerrit Friends. Aldona Bar, M.D.  Electronically Signed     RMW/MEDQ  D:  07/31/2008  T:  08/01/2008  Job:  [id number]

## 2010-12-31 NOTE — Assessment & Plan Note (Signed)
[facility]                            CARDIOLOGY OFFICE NOTE   Brandy Carpenter, Brandy Carpenter                       MRN:          [id number]  DATE:05/08/2008                            DOB:          [birth year]    IDENTIFICATION:  Brandy Carpenter is a 30 year old who is now 38 weeks'  pregnant.  She has a history of palpitations, dizziness prior to  pregnancy.  During pregnancy, she has had frank syncope a few times.  I  last saw her back in August.  I had felt that she had some autonomic  dysfunction with orthostatic intolerance before pregnancy and then  exacerbated by pregnancy.  I recommended adequate fluid and salt intake.  Blood work that I had done was negative.   In addition, I follow the patient in clinic for trivial pulmonic  stenosis.  She had previously been followed by Dr. Lorna Few.  Overall, on echo, her LV, RV, and valvular function looks grossly  normal.   Since I saw her last, she has actually been feeling some better.  Friday  was the only time since I saw her where she had a spell at 10:00 a.m.  She was at her son's daycare and she started to feel her vision tunnel  some.  She sat down on the floor.  The episode lasted about 2-3 minutes.  She drank some water and then for the rest of the day, felt somewhat  tired, laid down, but on Saturday and Sunday did fine.  She says she is  drinking fluid amply.   CURRENT MEDICATIONS:  Prenatal vitamins.   PHYSICAL EXAMINATION:  GENERAL:  The patient is in no distress.  VITAL SIGNS:  Blood pressure is lying 110/70, pulse 86; sitting 110/70,  pulse 87; standing at 0 minutes 105/70, pulse 98; standing at 2 minutes  107/67, pulse 107; and at 5 minutes 111/70, pulse 93.  The patient  asymptomatic throughout.  LUNGS:  Clear.  CARDIAC:  Regular rate and rhythm, S1 and S2.  No S3.  No significant  murmurs.  ABDOMEN:  Distended.  EXTREMITIES:  No significant edema.   IMPRESSION:  Brandy Carpenter is a  30 year old who is now in her second  pregnancy.  On looking back at her history, she has always had  palpitations and some dizziness.  This was exacerbated by this pregnancy  where she had frank syncope.  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  Since I saw her as she progresses in her pregnancy with  the increased volume, her symptoms have improved.  Indeed today, she  does not meet criteria for any significant orthostatic intolerance.   I would recommend that she continue to drink ample fluids.  As the  pregnancy progresses, to elevate her feet when she is not on them.  Otherwise, I would not suggest anything else.  I have not set a definite  followup.  I think, she should do okay from the delivery standpoint and  for the rest of the pregnancy.   In regards to her history of trivial pulmonic stenosis,  indeed the last  echocardiogram showed no significant gradient across the valve, LV, RV  were normal.  I have set followup tentatively for next winter to see how  she has done in the period during and after pregnancy.     Brandy Riffle, MD, [facility]  Electronically Signed    PVR/MedQ  DD: 05/08/2008  DT: 05/08/2008  Job #: [id number]   cc:   Nestor Ramp Ob/Gyn

## 2010-12-31 NOTE — Assessment & Plan Note (Signed)
[facility]                            CARDIOLOGY OFFICE NOTE   Brandy, Carpenter                       MRN:          [id number]  DATE:06/22/2008                            DOB:          1980/10/12    IDENTIFICATION:  Brandy Carpenter is a 30 year old woman who is now [redacted] weeks  pregnant.  I saw her few weeks ago in clinic.  She has had autonomic  dysfunction and episodes of syncope.   She called this morning to say she just want not feeling well and she  has been weak, just gives out.  She has been at bedrest quite a bit.  She has had some presyncopal, question syncopal spells.  Appetite is  somewhat down.   She did say that the IV fluid given a few weeks ago (2 L).  She felt  good for a couple of days and then started feeling weak again.   PHYSICAL EXAMINATION:  GENERAL:  The patient is in no distress at rest.  VITAL SIGNS:  Blood pressure lying 111/74, pulse 99; sitting 113/76,  pulse 92; standing 113/76, pulse 95 at 0 minutes, at 2 minutes 113/76,  pulse 96, at 5 minutes 126/84, pulse 100.  The patient had to take a  minute to get it together she said with sitting, but otherwise was  asymptomatic throughout.  LUNGS:  Clear.  CARDIAC:  Regular rate and rhythm.  S1 and S2.  No S3.  No significant  murmurs.  ABDOMEN:  Distended.  EXTREMITIES:  No edema.  Pulses 2+.   IMPRESSION:  Brandy Carpenter actually was not orthostatic on examination today.  I am not sure if she is feeling weaker now because the baby is getting  bigger.  She does not sound like she is in any heart failure on exam.   The patient had an IV, and I went ahead and gave her almost a liter of  fluid.  She said she felt some better after.  I am reluctant to give her  more because I think she may get more short of breath with it.  I will  check a CBC, BMET, and also urinalysis today, and I have left a message  with her obstetrician.   I will set to see her back in a couple weeks for now.   She should of  course call if she had any more problems.  Again, no driving.  I also  set her up for an echocardiogram just to make sure there is nothing  else.     Pricilla Riffle, MD, [facility]  Electronically Signed    PVR/MedQ  DD: 06/22/2008  DT: 06/23/2008  Job #: [id number]   cc:   Nestor Ramp OB/[facility]

## 2010-12-31 NOTE — Assessment & Plan Note (Signed)
[facility]                            CARDIOLOGY OFFICE NOTE   Brandy Carpenter, Brandy Carpenter                       MRN:          [id number]  DATE:07/10/2008                            DOB:          July 14, 1981    IDENTIFICATION:  Brandy Carpenter is a woman I follow in clinic.  She is now in  her 31st week of pregnancy.  I last saw her in clinic actually June 22, 2008.  She had IV hydration given, the patient improved with this.   I got a call last week, she was feeling poorly weak, dizzy.  No episodes  of syncope though.  She comes in today for return visit.   She denies any syncope since I have seen her.  She states actually  yesterday she was feeling a little better.  Today, she feels pretty  good, but Saturday was a bad day.  Her current medicines include  prenatal vitamins.   PHYSICAL EXAMINATION:  GENERAL:  The patient is in no acute distress at  rest.  VITAL SIGNS:  Blood pressure lying 111/73, pulse 95; sitting 109/71,  pulse 93; standing at 0-minute 116/68, pulse 93; 2 minutes 116/74, pulse  95; and at 5 minutes 116/75, pulse 94.  LUNGS:  Clear without rales.  CARDIAC:  Regular rate and rhythm, S1 and S2.  No S3.  No murmurs.  ABDOMEN:  Distended.  EXTREMITIES:  No edema.   IMPRESSION:  1. Syncope.  The patient again has a history of autonomic dysfunction      with peripheral pooling, orthostatic changes, and syncope.  This      has been exacerbated by the pregnancy.  Earlier in pregnancy, she      was not doing well, then she did better, then she has reverted      today though and yesterday she has been feeling better.  She is not      orthostatic today.  I see no indication to give her IV fluids.  I      have told her to take adequate IV fluids and salt intake.      Activities as tolerated.  Elevate her legs as needed.  She is now      getting to the point of weekly followups with her OB.  I have      recommended that she have her orthostatics  checked there.  I am not      going to set a definite followup if she is feeling better, but if      her symptoms recur, then I would be happy to see her.  She will      call.  2. History of mild pulmonic stenosis.  Echocardiogram in December 2008      showed no significant gradient.  Left ventricle and right ventricle      are normal.   I will set otherwise to see the patient back in a few months, but again  if she has problems again throughout this pregnancy, I will be happy to  see her again.  Note, I have recommended  no driving.  Activities as  tolerated.     Pricilla Riffle, MD, [facility]  Electronically Signed    PVR/MedQ  DD: 07/10/2008  DT: 07/11/2008  Job #: [id number]   cc:   Ilda Mori, M.D.

## 2010-12-31 NOTE — Assessment & Plan Note (Signed)
[facility]                            CARDIOLOGY OFFICE NOTE   EVERETTE, DIMAURO                       MRN:          [id number]  DATE:06/08/2008                            DOB:          Oct 21, 1980    IDENTIFICATION:  The patient is a 30 year old woman who I have followed.  She is now about 26 or [redacted] weeks pregnant.  She has a history of  autonomic dysfunction with orthostatic intolerance.  I last saw her in  clinic on May 09, 2007.  At that time, she was doing pretty well.  Her symptoms had improved.   Johnsie called in the interval and said she was feeling poorly again.  She was having a couple episodes of syncope per day at times.  She was  just feeling washed out.  She was seen by Dr. Arlyce Dice at [facility] earlier this week.  He did not change anything for her   She comes in today again not feeling well, dizzy when she stands, no  syncope today.   Current medicines include prenatal vitamins.   PHYSICAL EXAMINATION:  GENERAL:  The patient is in no distress at rest.  VITAL SIGNS:  Blood pressure lying 105/68, pulse 84; sitting 107/70,  pulse 100; standing at 0 minutes 96/66, pulse 100, at 2 minutes 104/70,  pulse 106, at 5 minutes 103/73, pulse 117.  The patient dizzy  throughout.  LUNGS:  Clear.  No rales.  CARDIAC:  Regular rate and rhythm, S1 and S2, no S3, no murmurs.  ABDOMEN:  Distended.  EXTREMITIES:  No edema.   IMPRESSION:  1. Orthostatic intolerance.  It is back.  Last visit she was doing      better, but now she is doing worse.  I encouraged her to take      things at her own pace, to stay adequately hydrated, add adequate      salt.  If she feels dizzy, she needs to sit back so she does not      faint and hurt herself.  I do not think bedrest is the answer.  She      will probably end up losing venous tone and may have worsening of      symptoms when she does get up, but we may need to change this      depending  on how she feels over the next several weeks.   For today, I will give her a liter and a half of IV normal saline.  I  would like her to call if she feels worse again.  Otherwise, I will set  to see her in 2 weeks.  I will be in touch with her and tell her to add  a salt tablet to her regimen 1-2 times per day.   On review with pharmacy, Florinef is class C in the [place] (class  A in [place]) for pregnancy.  We would hold for now.     Pricilla Riffle, MD, [facility]  Electronically Signed    PVR/MedQ  DD: 06/08/2008  DT: 06/09/2008  Job #: [id number]   cc:   Ilda Mori, M.D.

## 2010-12-31 NOTE — Assessment & Plan Note (Signed)
[facility]                            CARDIOLOGY OFFICE NOTE   Brandy Carpenter, Brandy Carpenter                       MRN:          [id number]  DATE:03/20/2008                            DOB:          [birth year]    IDENTIFICATION:  Mr. Brandy Carpenter is a 30 year old woman who I saw last in July  2009.  She is now about 14 weeks' pregnant.  She has a history of  palpitations and dizziness prior to pregnancy, but they have worsened  now with syncope.   When I saw her last, I encouraged her to increase her fluid and salt  intake and to take activities as tolerated, but not to drive.   She says she is increasing her fluid intake or trying to.  She is not  eating a lot.  She is still having bouts of dizziness and has a few  spells of syncope.  Again, she has had some warning and has been able to  sit down to avoid getting hurt.  Her current medicines include prenatal  vitamin.   PHYSICAL EXAMINATION:  On exam, the patient is in no distress at rest.  Lying, 120/77 blood pressure, heart rate 90; sitting, 116/72, pulse 99,  slightly dizzy.  Standing at 0 minute, very dizzy, 120/74, pulse 97.  Two minutes dizzy with improving, blood pressure was 123/77, pulse 102.  The patient with 5 minutes of standing seem to be flushed and hot,  slightly dizzy, 113/72, pulse 111.  Her lungs are clear.  Cardiac exam  regular rate and rhythm, S1 and S2.  No S3.  No significant murmurs.  Abdomen is distended.  Extremities, no edema.   IMPRESSION:  Syncope/dizziness.  Again, I think the patient has had a  tendency towards orthostatic intolerance even before pregnancy and she  is at the worst time of her pregnancy.  I again would encourage her to  increase her fluid and salt intake.  Indeed, her urine on last check was  a little concentrated.  I told her to go ahead and buy some over-the-  counter salt tablets to ensure increased salt intake.   Today, I would check a TSH and a cortisol just to  confirm secondary  causes.  Otherwise, I will set to see the patient back in several weeks.  Again refrain from driving, avoid high heat, and  should take multiple  small meals.     Pricilla Riffle, MD, [facility]  Electronically Signed    PVR/MedQ  DD: 03/21/2008  DT: 03/22/2008  Job #: [id number]   cc:   Nestor Ramp OB[facility]

## 2010-12-31 NOTE — Assessment & Plan Note (Signed)
[facility]                            CARDIOLOGY OFFICE NOTE   EMOJEAN, GERTZ                       MRN:          [id number]  DATE:08/27/2007                            DOB:          [birth year]    IDENTIFICATION:  Ms. Riggenbach is a 30 year old woman who I saw in the past,  actually back in 2005.  She has a history of pulmonic stenosis trivial  and had been followed by Dr. Lorna Few in the past, last in 1996.   She was re-referred for evaluation in December complaining of a year and  half of palpitations that had gotten worse, occurred 1-2 times per week  and lasted 10-15 minutes.  She feels the episodes increase with stress.  She feels her heart rate, becomes sweaty, dizzy, short of breath and  feels like she is going to have a heart attack.  Breathing slows down  and she takes a deep breath and it eases off.  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  It twice happened at work and they  called EMS.  Told she was having a panic attack and showed her how to  slow her breathing.   She was set up on this visit for an event monitor.  This showed only  mild sinus tachycardia, rates of 110.  These were during times when she  was feeling panicky.   Labs were also checked which were unremarkable.  TSH was normal.  The  patient also had an echocardiogram.  LVF was normal.  There were no wall  motion abnormalities.  Pulmonic valve actually did not appear to be  stenotic.   PHYSICAL EXAM:  On exam the patient is in no distress.  Blood pressure  on arrival 133/8, blood pressure lying 131/88, pulse 80; sitting with  some symptoms 124/84, pulse 79; standing at zero minutes 120/83, pulse  83; at 2 minutes 120/80, pulse 84, 5 minutes 121/81, pulse 88.  No  symptoms at this time.  Lungs are clear.  Cardiac exam regular rate and  rhythm, S1-S2.  No S3.  No murmurs.  Abdomen is benign.  Extremities no  edema.   1. Palpitations,  dizziness.  They appear to be stress related not      clearly orthostatic.  On examination today she does drop her blood      pressure a little bit.  There are transient symptoms with standing.      I am not convinced she is orthostatic but I have encouraged her to      increase her salt and fluid intake and I will contact her to see      how she is doing.  Indeed, some of these may be stress related,      question panic.  I do not see any definite cardiac etiology even      with the event monitor and they do not sound truly like a rhythm      issue given when they come on.  I will be in touch with the patient  in a few weeks to see how she is doing.  I have tried to reassure      her.  I told her to try to take big breaths and work herself      through.  Question if indeed she may need some medication for this.  2. History of mild pulmonic stenosis.  Again, echo does not show this.      RV LV are normal and      valves without significant gradient.  I would follow up only p.r.n.      I do not think she needs antibiotic prophylaxis as she has been      doing.  I will be in touch with her regarding followup.     Pricilla Riffle, MD, [facility]  Electronically Signed    PVR/MedQ  DD: 08/28/2007  DT: 08/28/2007  Job #: [id number]

## 2010-12-31 NOTE — Assessment & Plan Note (Signed)
[facility]                            CARDIOLOGY OFFICE NOTE   JEIMY, BICKERT                       MRN:          [id number]  DATE:02/17/2008                            DOB:          April 20, 1981    IDENTIFICATION:  Mr. Stuckert is a 30 year old woman that I have seen in the  past.  She had a history of palpitations and dizziness, and she had been  followed previously by Noel Christmas for trivial pulmonic stenosis.  I  have discharged her for followup from the standpoint based on an  echocardiogram in 2008.   The patient comes in today for followup.  She is [redacted] weeks pregnant.  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  Yesterday, she was seen by her OB.  She was walking down stairs  after the appointment, felt dizzy again, and passed out.  She notes  things going a little dark, feeling warm.   CURRENT MEDICATIONS:  Include prenatal vitamin.   PHYSICAL EXAMINATION:  GENERAL:  The patient is in no distress at rest.  VITAL SIGNS:  Blood pressure orthostatic 113/72 lying, pulse 80; sitting  a little dizzy 116/72, pulse 81; standing at zero minute 120/72 and  pulse 94; at 2 minutes 111/72 and pulse 93; dizzy at 5 minutes 118/74  and pulse 92.  LUNGS:  Clear.  CARDIAC:  Regular rate and rhythm.  S1 and S2.  No S3.  No significant  murmurs.  ABDOMEN:  Benign distended.  EXTREMITIES:  No edema.   IMPRESSION:  Syncope.  I think this is most likely related to her  baseline autonomic dysfunction (which has been mild in the setting of  being [redacted] weeks pregnant on top of being in very warm weather).  She says  she is drinking a lot of water.  I have encouraged her to increase her  salt intake and take activities as tolerated.  If she has any suggestion  of getting dizzy, she needs to stop and sit down where she has not tried  to get to some places that she may pass out.  The only real danger is  that she falls and  hits her head or some place that causes injury.  I  think overall the baby should do okay.   This is the most difficult period from a blood pressure regulation  standpoint in pregnancy.  If we can get her past 15 or 16 weeks without  spells that would be good.  I think things will improve and I told her  this.  For now although ample, fluid, salt, and activities as tolerated.  She should not be driving.     Pricilla Riffle, MD, [facility]  Electronically Signed    PVR/MedQ  DD: 02/17/2008  DT: 02/18/2008  Job #: [id number]

## 2011-01-03 NOTE — Letter (Signed)
January 23, 2009    Claim #: [id number]   RE:  LENNAN, MALONE  MRN:  [id number]  /  DOB:  August 18, 1981   To Whom It May Concern:   Ms. Varghese is a 30 year old woman who I follow in clinic.  She has had a  history of dizziness and significant syncope with multiple episodes in  the past.  She also has a history of headaches.   I last saw the patient in clinic last week.  She is currently pregnant  again and is developing worsening dizziness.  She is at home taking care  of her children.  She also has complained of headaches.   Testing so far has included an MRI of her head and neck.  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  She is scheduled to undergo evaluation  by Dr. Sandria Manly in Neurology.  The neck MRI showed extensive lymphadenopathy  (CT of the chest, abdomen, and pelvis were negative for  lymphadenopathy).  She is undergoing ENT evaluation for biopsy.   The patient will be seen by her obstetrician, Dr. Arlyce Dice regarding this  pregnancy.  Until the situation is stabilized and until her  dizziness/syncopal symptoms improve, she is not fit to drive or work.  If you have any questions, please feel free to contact me at [phone].    Sincerely,      Pricilla Riffle, MD, [facility]  Electronically Signed    PVR/MedQ  DD: 01/23/2009  DT: 01/24/2009  Job #: [id number]

## 2011-01-03 NOTE — Discharge Summary (Signed)
NAMESAMELLA, LUCCHETTI                ACCOUNT NO.:  [id number]   MEDICAL RECORD NO.:  [id number]          PATIENT TYPE:  OBV   LOCATION:  9198                          FACILITY:  [facility]   PHYSICIAN:  Gerrit Friends. Aldona Bar, M.D.   DATE OF BIRTH:  [birth year]   DATE OF ADMISSION:  08/20/2008  DATE OF DISCHARGE:  08/20/2008                               DISCHARGE SUMMARY   FINAL DIAGNOSIS:  Intrauterine pregnancy at 66 weeks' gestation, soft  labor.   COMPLICATIONS:  None.   This 30 year old, G2, P1 presents on 4 weeks' gestation thinking she  was in labor with questionable spontaneous rupture of membranes.  The  patient was admitted.  Cervix was about 1-2 cm dilated, 30% effaced at -  3 station.  There was no evidence of rupture of membranes.  An  ultrasound performed to check amniotic fluid volume and BPP, and the  patient was continued to be monitored.  Her contraction has decreased  over the hospital course of the day, and she had an amniotic fluid  volume of 9+ with a BPP of 8 over 8.  Because the patient is not  actually in labor and no rupture of membranes noted, the patient was  felt ready for discharge.  She was sent home with labor precautions.  She was given some Ambien 10 mg to use at night to help sleep and is to  follow up in our office on the 5th for her routine visit.      Leilani Able, P.A.-C.      Gerrit Friends. Aldona Bar, M.D.  Electronically Signed    MB/MEDQ  D:  09/27/2008  T:  09/28/2008  Job:  [id number]

## 2011-01-03 NOTE — Letter (Signed)
February 29, 2008     RE:  Brandy Carpenter, Brandy Carpenter  MRN:  [id number]  /  DOB:  02/28/1981   To whom it may concern:   This is a letter regarding [employer].  She is a 30 year old woman  whom I see in cardiology clinic.  She has had a history of palpitations  and dizziness in the past.  She came in to clinic on July 2 of this  year.  She was [redacted] weeks pregnant when I saw her.   On her last appointment, she told me she had had a couple episodes of  syncope.  On exam, she did have some evidence of mild orthostasis  (borderline).  I did not do a prolonged check.   For now I have told her to drink adequate fluids, increase her salt  intake, elevate her legs if possible.  She should try to avoid extremes  in temperatures.  She should refrain from driving as she may indeed hurt  herself or others.   I will continue to follow her in clinic throughout her pregnancy.  If  you have any questions please call.  My phone number is [phone].  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    Sincerely,      Pricilla Riffle, MD, [facility]  Electronically Signed    PVR/MedQ  DD: 02/29/2008  DT: 02/29/2008  Job #: [id number]

## 2011-01-03 NOTE — Discharge Summary (Signed)
NAMEJENDAYA, Brandy Carpenter                ACCOUNT NO.:  [id number]   MEDICAL RECORD NO.:  [id number]          PATIENT TYPE:  INP   LOCATION:  9157                          [facility]ACILITY:  [facility]   PHYSICIAN:  Gerrit Friends. Aldona Bar, M.D.   DATE OF BIRTH:  03-15-1981   DATE OF ADMISSION:  08/19/2004  DATE OF DISCHARGE:  08/19/2004                                 DISCHARGE SUMMARY   DISCHARGE DIAGNOSIS:  35 week intrauterine pregnancy, undelivered.   SUMMARY:  This 30 year old, gravida 2, para 0, with a due date of February 7  was admitted earlier this morning with questionable ruptured membranes.  She  related some slight vaginal discharge for approximately one weeks time.  She  was also contracting at the time of admission.  In triage, she received two  doses of terbutaline and then after reaction to the second dose of  terbutaline, was placed on magnesium sulfate.  She did receive an  ultrasound.  This showed normal amniotic fluid volume.  Apparently, there  was positive ferning in the maternity admissions unit.  At this time, the  patient has been off of magnesium for approximately two to three hours, she  has had minimal contractions, she is comfortable and there is no obvious  amniotomy.   The patient was examined.  Her vital signs were stable.  Her abdominal  examination was consistent with her dates.  Her fetal heart rate was  reactive.  The cervix was 1 cm dilated, very thick, vertex -2 to more likely  -3 station.  No ruptured membranes were seen.  Extremities negative.   LABORATORY DATA:  White count of 6,400, hemoglobin of 11.4, a platelet count  of 192,000, a negative urinalysis, a uric acid of 3.8, comprehensive  metabolic panel that was essentially normal and an LDH of 140.   It was felt that the patient probably had an episode of significant Braxton  Hicks contractions, but at this point she was stable, not contracting and  there was no evidence of amniotomy on either examination or  ultrasound.   A decision was made to send the patient home with office follow-up with very  specific, appropriate instructions of when to return and what to watch out  for.   CONDITION ON DISCHARGE:  Improved.      RMW/MEDQ  D:  08/19/2004  T:  08/19/2004  Job:  [id number]

## 2011-01-03 NOTE — Op Note (Signed)
[facility]  Patient:    Brandy Carpenter, Brandy Carpenter                       MRN: [id number] Proc. Date: 06/11/00 Adm. Date:  [id number] Attending:  Colbert Ewing                           Operative Report  PREOPERATIVE DIAGNOSIS:  Multidirectional anteroinferior instability of left shoulder, traumatic etiology.  POSTOPERATIVE DIAGNOSIS:  Multidirectional anteroinferior instability of left shoulder, traumatic etiology, with mild scuffing, posterior aspect humeral head.  Marked capsular redundancy and laxity.  OPERATION PERFORMED:  Left shoulder examination under anesthesia, diagnostic arthroscopy followed by open anteroinferior Neer type capsular shift.  SURGEON:  Loreta Ave, M.D.  ASSISTANT:  Arlys John D. Petrarca, P.A.-C.  ANESTHESIA:  General.  ESTIMATED BLOOD LOSS:  Minimal.  TOURNIQUET:  Not employed.  SPECIMENS:  None.  CULTURES:  None.  COMPLICATIONS:  None.  DRESSING:  Soft compressive with shoulder immobilizer.  DESCRIPTION OF PROCEDURE:  The patient was brought to the operating room and after adequate anesthesia had been obtained, the patient was examined.  Global ligamentous laxity which was moderate.  Both shoulders could be subluxed inferiorly and posteriorly but on the left symptomatic side there was a much more demonstrable anteroinferior instability.  Placed in beach chair position on a shoulder positioner.  Prepped and draped in the usual sterile fashion. Posterior arthroscopic portal utilized to enter the shoulder, distended with saline and inspected with the arthroscope.  Redundancy and stretching of the capsule with the humeral head being able to be subluxed anterior and inferiorly fairly easily.  Capsular stretching but no Bankart lesion. Scuffing on the back of the humeral head consistent with an anteroinferior dislocation pattern.  Instruments and fluid removed.  Anterior incision from the coracoid to the anterior  axillary fold.  Skin and subcutaneous tissues divided, hemostasis obtained with electrocautery.  Deltopectoral interval developed and the small Charnley retractor put in place.  Subscap tendon was identified and taken down sharply leaving a portion of the tendon on the capsule to thicken it.  Tagged with nonabsorbable suture.  The capsule was then cut along the humeral attachment from the 12 oclock to 6 oclock position, protecting the axillary nerve inferiorly.  It was then cut from the humeral to the glenoid attachment at the 3 oclock position.  The inferior leaflet was brought up to the 12 oclock position and firmly sewn in place with multiple nonabsorbable #2 Ethibond sutures.  This took up the redundancy of the inferior as well as anterior capsule.  The superior leaflet was then brought down to the 5 oclock position overlapping the inferior leaflet and sewed in place with multiple nonabsorbable sutures.  The shoulder was examined with good motion and marked improvement of stability.  Subscap was repaired anatomically with multiple nonabsorbable #2 sutures.  The wound was irrigated. The deltopectoral interval allowed to close. Subcuticular closure with Vicryl and Steri-Strips.  Portals closed with nylon.  Margins of wound injected with Marcaine was the shoulder.  Sterile compressive dressing and shoulder immobilizer applied.  Anesthesia reversed.  Brought to recovery room. Tolerated surgery well.  No complications. DD:  06/11/00 TD:  06/12/00 Job: [id number]
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
## 2011-01-07 ENCOUNTER — Telehealth: Payer: Self-pay | Admitting: Internal Medicine
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
## 2011-01-07 NOTE — Telephone Encounter (Signed)
LMOM for call back. 

## 2011-01-08 ENCOUNTER — Encounter: Payer: Self-pay | Admitting: Internal Medicine

## 2011-01-09 ENCOUNTER — Ambulatory Visit (INDEPENDENT_AMBULATORY_CARE_PROVIDER_SITE_OTHER): Payer: Self-pay | Admitting: Internal Medicine

## 2011-01-09 DIAGNOSIS — I10 Essential (primary) hypertension: Secondary | ICD-10-CM

## 2011-01-09 DIAGNOSIS — R55 Syncope and collapse: Secondary | ICD-10-CM

## 2011-01-09 DIAGNOSIS — G9009 Other idiopathic peripheral autonomic neuropathy: Secondary | ICD-10-CM

## 2011-01-09 LAB — CBC WITH DIFFERENTIAL/PLATELET
Basophils Absolute: 0 10*3/uL (ref 0.0–0.1)
Eosinophils Relative: 2.1 % (ref 0.0–5.0)
HCT: 36.7 % (ref 36.0–46.0)
Lymphs Abs: 1.9 10*3/uL (ref 0.7–4.0)
Monocytes Absolute: 0.3 10*3/uL (ref 0.1–1.0)
Monocytes Relative: 7.1 % (ref 3.0–12.0)
Neutrophils Relative %: 51.1 % (ref 43.0–77.0)
Platelets: 213 10*3/uL (ref 150.0–400.0)
RDW: 15.7 % — ABNORMAL HIGH (ref 11.5–14.6)
WBC: 4.9 10*3/uL (ref 4.5–10.5)

## 2011-01-09 LAB — BASIC METABOLIC PANEL
CO2: 28 mEq/L (ref 19–32)
Calcium: 9.2 mg/dL (ref 8.4–10.5)
GFR: 139.29 mL/min (ref >60.00)
Glucose, Bld: 94 mg/dL (ref 70–99)
Potassium: 3.9 mEq/L (ref 3.5–5.1)
Sodium: 138 mEq/L (ref 135–145)

## 2011-01-09 LAB — ALBUMIN: Albumin: 3.8 g/dL (ref 3.5–5.2)

## 2011-01-09 LAB — SEDIMENTATION RATE: Sed Rate: 24 mm/hr — ABNORMAL HIGH (ref 0–22)

## 2011-01-09 LAB — BRAIN NATRIURETIC PEPTIDE: Pro B Natriuretic peptide (BNP): 7 pg/mL (ref 0.0–100.0)

## 2011-01-09 NOTE — Patient Instructions (Signed)
Lab work today. Will call you with results.

## 2011-01-10 ENCOUNTER — Telehealth: Payer: Self-pay | Admitting: *Deleted

## 2011-01-10 NOTE — Telephone Encounter (Signed)
LMOM for call back. 
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
## 2011-01-15 NOTE — Assessment & Plan Note (Signed)
Diastolic BP is a little high  I would not change regimen for now.
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
## 2011-01-16 NOTE — Telephone Encounter (Signed)
See other note

## 2011-01-24 IMAGING — CT CT CHEST W/ CM
1 of 2 series · 14 of 31 positions shown, 18 images · IV contrast (omnipaque)
Comparison: MRI neck 01/06/2009

CT CHEST

CLINICAL DATA: Lymphadenopathy on neck MRI 01/06/2009

CT CHEST, ABDOMEN AND PELVIS WITH CONTRAST
TECHNIQUE: Multidetector CT imaging of the chest, abdomen and
pelvis was performed following the standard protocol during bolus
administration of intravenous contrast.
Contrast: 125 ml Omnipaque 300

[Series 2: cap with · axial · 0.67mm/px · z∈[-350,+194]mm · 14 of 123 slices shown, 18 images]
[im 7/123  mediastinal]
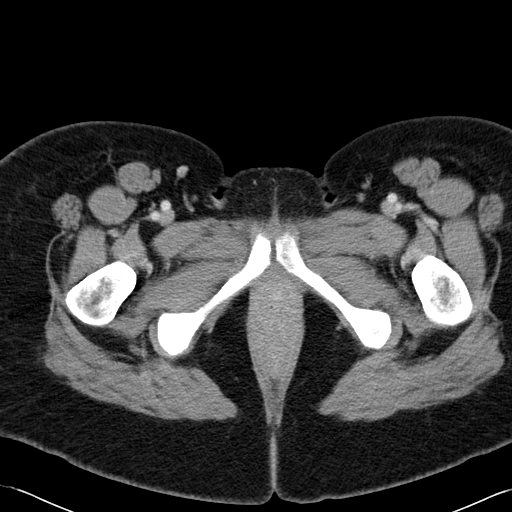
[im 7/123  lung]
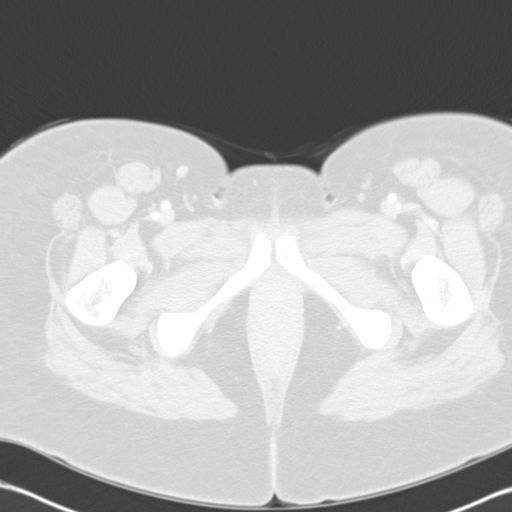
[im 20/123  lung]
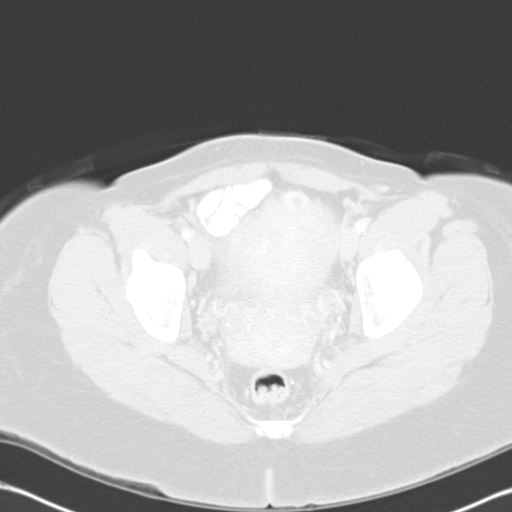
[im 26/123  lung]
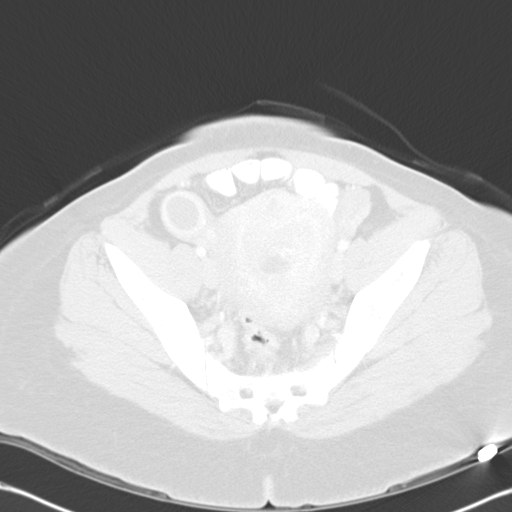
[im 39/123  lung]
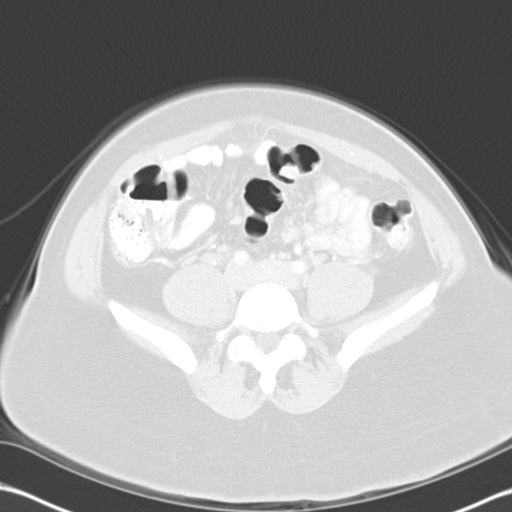
[im 41/123  mediastinal]
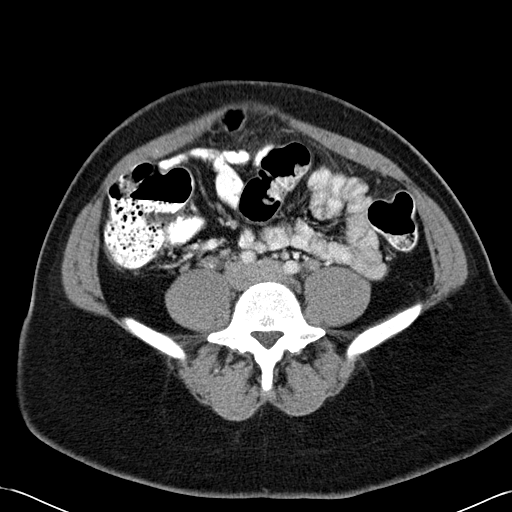
[im 41/123  lung]
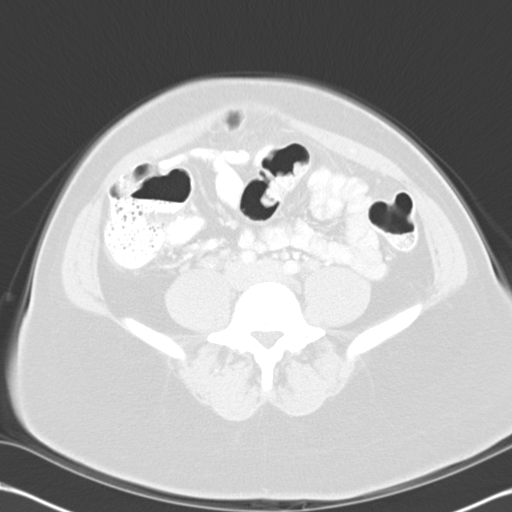
[im 52/123  lung]
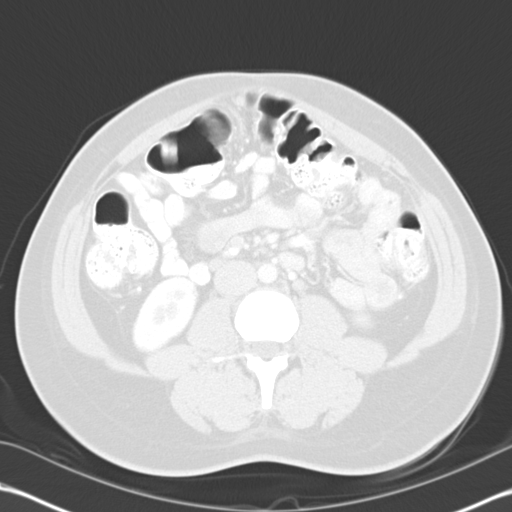
[im 58/123  lung]
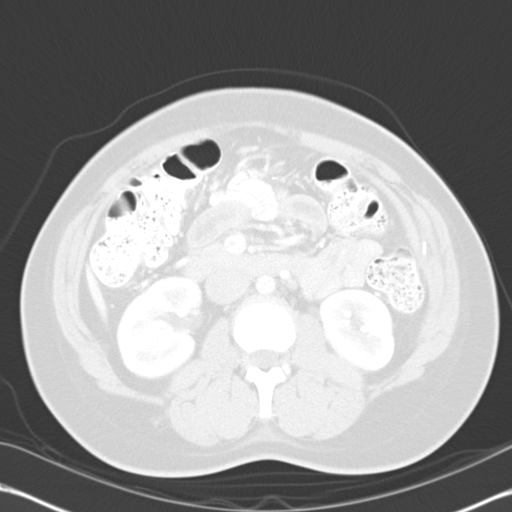
[im 65/123  lung]
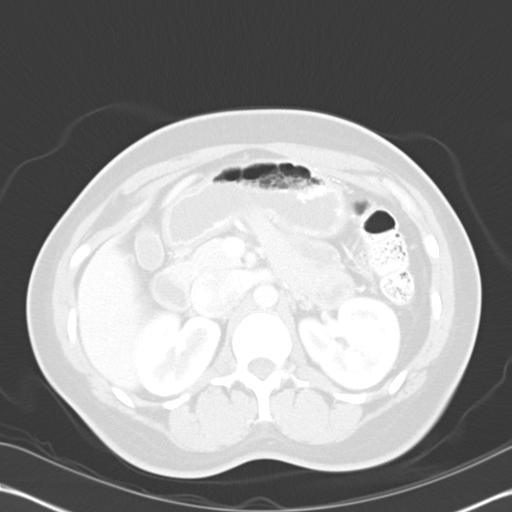
[im 71/123  mediastinal]
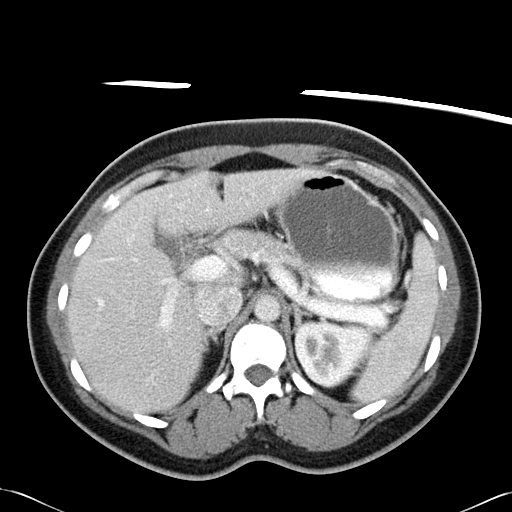
[im 71/123  lung]
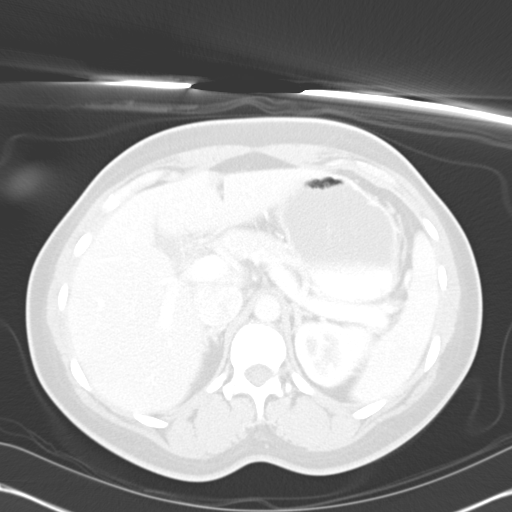
[im 82/123  lung]
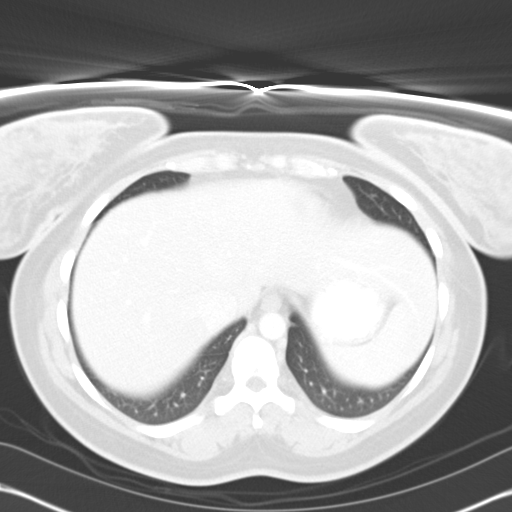
[im 84/123  lung]
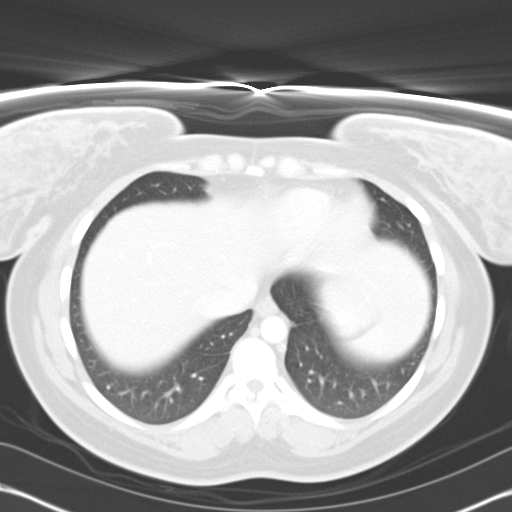
[im 97/123  lung]
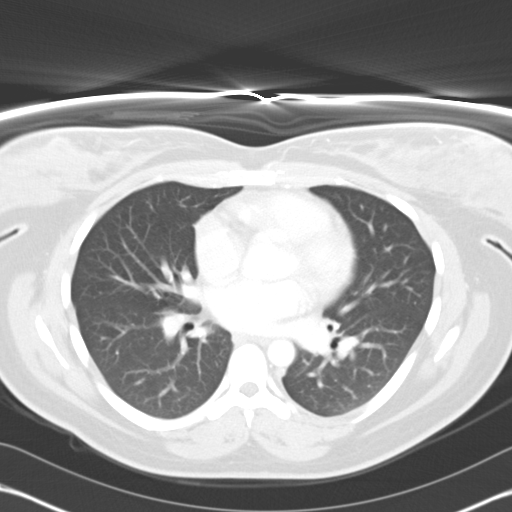
[im 103/123  mediastinal]
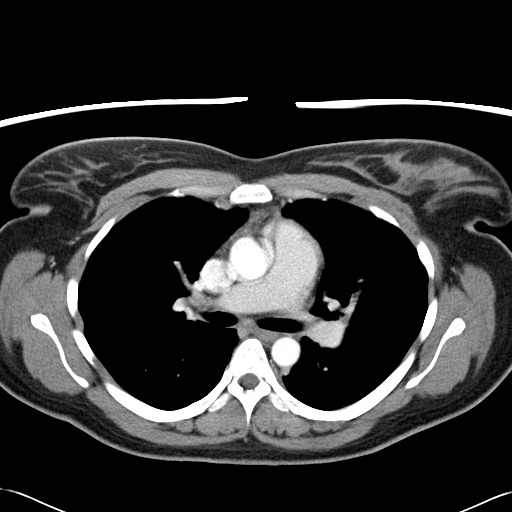
[im 103/123  lung]
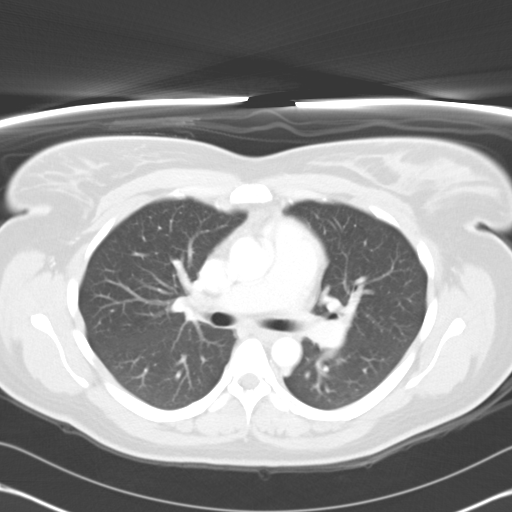
[im 116/123  lung]
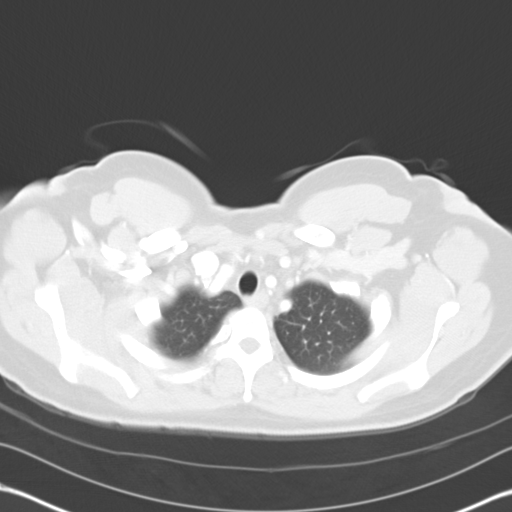

[14 of 31 positions shown; findings below may reference images not displayed]

FINDINGS: No evidence of axillary, supraclavicular, mediastinal,
or hilar lymphadenopathy.  Small 7 mm left supraclavicular node
(image 7) is not pathologic by CT size criteria.  Heart appears
normal without evidence of pericardial effusion.

5 mm nodule within the right middle lobe (image 24).  Airway
appears normal.
IMPRESSION: 1.  No evidence of lymphadenopathy within the thorax.
2.  Small 5 mm pulmonary nodule is likely benign in patient of this
age group. If the patient is  low risk for carcinoma recommend
follow-up noncontrast CT in 12 months. If high risk recommend
follow-up in 6 to 12  months per  Fleischner criteria.

This recommendation follows the consensus statement: "Guidelines
for Management of Small Pulmonary Nodules Detected on CT Scans:  A
Statement from the [HOSPITAL]" as published in Radiology
3662; [DATE].  Available online at:
[URL]

CT ABDOMEN
FINDINGS: No focal hepatic lesion.  The gallbladder, pancreas,
spleen, adrenal glands, and kidneys appear normal. The stomach,
small bowel, appendix, and cecum appear normal.  The colon is
normal. The abdominal aorta is normal in caliber.  No evidence of
retroperitoneal or periportal lymphadenopathy.
IMPRESSION: 1.  No evidence of abdominal lymphadenopathy.
2.  No acute abdominal process.

CT PELVIS
FINDINGS: There is a 20 mm peripherally enhancing cystic lesion
within the endometrial canal.  There is an enhancing 2.7 cm cystic
lesion in the right ovary with peripheral enhancement.  There is a
small round exophytic 11 mm enhancing lesion along the anterior
fundus (image 104).

No free fluid the pelvis.  The rectum and sigmoid colon appear
normal.

No evidence of pelvic lymphadenopathy.  Single 12 mm right inguinal
lymph node is likely reactive.  Review bone windows demonstrates no
aggressive osseous lesions.
IMPRESSION: 1.  Findings concerning for intrauterine gestational sac with
right corpus luteal cyst.  Recommend correlation with Beta HCG and
pelvic ultrasound.  If the patient is not pregnant, finding could
represent a submucosal leiomyoma or endometrial polyp.

2.  Likely small serosal leiomyoma along the uterine fundus.
3.  No evidence of lymphadenopathy within the pelvis.
4.  Small right inguinal lymph node measuring 12 mm is likely
reactive.

The findings conveyed to Dr. Shirufa on 01/11/2009 at [DATE]

## 2011-01-31 ENCOUNTER — Telehealth: Payer: Self-pay | Admitting: Internal Medicine

## 2011-01-31 NOTE — Telephone Encounter (Signed)
Pt calling about diability papers--when i phoned pt she stated someone had called and taken care of her problem--nt

## 2011-01-31 NOTE — Telephone Encounter (Signed)
Pt calling re her disability papers. Pt states she has called several time and not heard anything back yet. Pt would like to talk to a nurse.

## 2011-02-27 ENCOUNTER — Encounter: Payer: Self-pay | Admitting: Internal Medicine

## 2011-03-05 ENCOUNTER — Ambulatory Visit (INDEPENDENT_AMBULATORY_CARE_PROVIDER_SITE_OTHER): Payer: Self-pay | Admitting: Internal Medicine

## 2011-03-05 ENCOUNTER — Encounter: Payer: Self-pay | Admitting: Internal Medicine

## 2011-03-05 VITALS — BP 126/91 | HR 83 | Ht 64.0 in | Wt 204.1 lb

## 2011-03-05 DIAGNOSIS — R55 Syncope and collapse: Secondary | ICD-10-CM

## 2011-03-05 NOTE — Assessment & Plan Note (Signed)
Her symptoms remain unexplained. 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 Her tongue biting raises at least a suspicion of a seizure. Evaluation so far including MRI scanning has been unremarkable except for the finding of a Chiari malformation. Today we discussed options for ongoing evaluation. One option would be to insert an implantable loop recorder. Recent data suggest that patients with neurally mediated syncope to have profound bradycardia will have a reduction in symptoms with pacemaker insertion. A finding of predominant vaso-depression would of course eliminate the need for pacemaker. I have considered the possibility of adding additional medications like midodrine or Florinef. Her underlying hypertension makes this a less desirable treatment option. Finally, I cannot find whether the patient has undergone EEG testing. I will review this with Dr. Tenny Craw.

## 2011-03-05 NOTE — Patient Instructions (Signed)
We will be in touch with you once Dr. Ladona Ridgel and Dr. Tenny Craw have spoken further.  Your physician recommends that you continue on your current medications as directed. Please refer to the Current Medication list given to you today.

## 2011-03-05 NOTE — Progress Notes (Signed)
HPI Brandy Carpenter is referred today by Dr. Tenny Craw for evaluation of recurrent syncope. The patient has had a long history of passing out. She notes that she has had at least 20 episodes in the past 10 years. She has never severely injured herself with these. Previously she would experience a warning and then passed out. She has never lost bowel or bladder continence. She notes that she has bitten her tongue on a couple of occasions though never required stitches. After the episodes, she feels like she has missed time but denies much in the way of nausea diaphoresis or vomiting. The patient denies chest pain or shortness of breath. Complicating her history is that of a diagnosis of pulmonic stenosis. Most recent echocardiography however, demonstrates no pulmonic gradient or pulmonic insufficiency. She has preserved left and right ventricular systolic function.  No Known Allergies   Current Outpatient Prescriptions  Medication Sig Dispense Refill  . furosemide (LASIX) 20 MG tablet Take 1 tablet daily only as needed for swelling  30 tablet  11  . pindolol (VISKEN) 5 MG tablet Take 1 tablet (5 mg total) by mouth 2 (two) times daily.  60 tablet  11  . Prenat w/o A-FE-DSS-Methfol-FA (PRENATAL MULTIVITAMIN) 90-600-400 MG-MCG-MCG tablet Take 1 tablet by mouth daily.          Past Medical History  Diagnosis Date  . Pulmonic stenosis     a. last echo 9/11: normal LVF, no evidence of PI or PS  . Syncope     Tilt neg. in 2010; likely autonomic dysfunction  . Arnold-Chiari malformation, type I     Dr. Terrace Arabia at [facility]  . HTN (hypertension)   . Edema     ROS:   All systems reviewed and negative except as noted in the HPI.   Past Surgical History  Procedure Date  . Tubal ligation   . Rotator cuff repair      Family History  Problem Relation Age of Onset  . Hypertension Mother   . Diabetes type II Mother   . Hypertension Father      History   Social History  . Marital Status: Married    Spouse  Name: N/A    Number of Children: 3  . Years of Education: N/A   Occupational History  . Not on file.   Social History Main Topics  . Smoking status: Never Smoker   . Smokeless tobacco: Not on file  . Alcohol Use: No  . Drug Use: No  . Sexually Active: Not on file   Other Topics Concern  . Not on file   Social History Narrative  . No narrative on file     BP 126/91  Pulse 83  Ht 5\' 4"  (1.626 m)  Wt 204 lb 1.9 oz (92.588 kg)  BMI 35.04 kg/m2  Physical Exam:  Well appearing young woman, NAD HEENT: Unremarkable Neck:  No JVD, no thyromegally Lymphatics:  No adenopathy Back:  No CVA tenderness Lungs:  Clear with no wheezes, rales, or rhonchi. HEART:  Regular rate rhythm, no murmurs, no rubs, no clicks. PMI was not enlarged or laterally displaced. Abd:  Soft, obese, positive bowel sounds, no organomegally, no rebound, no guarding Ext:  2 plus pulses, no edema, no cyanosis, no clubbing Skin:  No rashes no nodules Neuro:  CN II through XII intact, motor grossly intact   Assess/Plan:

## 2011-03-07 ENCOUNTER — Ambulatory Visit: Payer: Medicaid Other | Admitting: Internal Medicine

## 2011-03-13 ENCOUNTER — Encounter: Payer: Self-pay | Admitting: Internal Medicine

## 2011-03-17 ENCOUNTER — Telehealth: Payer: Self-pay | Admitting: Internal Medicine

## 2011-03-17 NOTE — Telephone Encounter (Signed)
Called patient and advised her that Dr.Taylor had not spoken to Dr.Ross yet, but she will call neuro to find out if she needs to have an EEG prior to possible loop implant. Will call back again with more information.

## 2011-03-17 NOTE — Telephone Encounter (Signed)
Pt wants to know when should she follow back up with Dr. Tenny Craw since she missed her appt on 7/20 and she wants to know have you heard anything about loop recorder placement because she has not heard anything

## 2011-03-17 NOTE — Telephone Encounter (Signed)
I sent via fax  [employer] diasability forms and last 2 office notes to Jaynie Collins at [phone].

## 2011-04-25 ENCOUNTER — Telehealth: Payer: Self-pay | Admitting: Internal Medicine

## 2011-04-25 NOTE — Telephone Encounter (Signed)
Info only Neurologist appt is scheduled for 9-10.  Syncopal episodes are more frequent.  Not sure what she should do now.  Aware Brandy Carpenter is off today.

## 2011-04-28 NOTE — Telephone Encounter (Signed)
Called and LM for call back.

## 2011-05-02 NOTE — Telephone Encounter (Signed)
LMOM for call back. 

## 2011-05-09 LAB — POCT PREGNANCY, URINE: Operator id: 235561

## 2011-05-13 ENCOUNTER — Encounter: Payer: Self-pay | Admitting: Internal Medicine

## 2011-05-13 NOTE — Telephone Encounter (Signed)
This encounter was created in error - please disregard.

## 2011-05-13 NOTE — Telephone Encounter (Deleted)
Pt is returning your She has seen neurologist

## 2011-05-13 NOTE — Telephone Encounter (Signed)
Pt calling you back she has seen neurologist

## 2011-05-13 NOTE — Telephone Encounter (Signed)
Patient called back and wanted Brandy Carpenter to know she had her EEG on 05/08/11  She had it done at [facility] and said they would be calling her and [place] with the report

## 2011-05-14 LAB — URINALYSIS, ROUTINE W REFLEX MICROSCOPIC
Glucose, UA: NEGATIVE
Hgb urine dipstick: NEGATIVE
Ketones, ur: NEGATIVE
pH: 6

## 2011-05-14 LAB — CBC
HCT: 35.4 — ABNORMAL LOW
Hemoglobin: 11.9 — ABNORMAL LOW
MCV: 78.2
WBC: 5.9

## 2011-05-14 LAB — WET PREP, GENITAL: Yeast Wet Prep HPF POC: NONE SEEN

## 2011-05-14 LAB — GC/CHLAMYDIA PROBE AMP, GENITAL: Chlamydia, DNA Probe: NEGATIVE

## 2011-05-15 LAB — CBC
HCT: 34.6 — ABNORMAL LOW
Hemoglobin: 11.5 — ABNORMAL LOW
Hemoglobin: 13
MCHC: 33.2
MCV: 80.7
Platelets: 189
RBC: 4.39
RBC: 4.84
WBC: 6.1

## 2011-05-15 LAB — DIFFERENTIAL
Eosinophils Relative: 1
Lymphocytes Relative: 32
Lymphs Abs: 1.9
Monocytes Absolute: 0.5
Monocytes Relative: 8

## 2011-05-15 LAB — URINALYSIS, ROUTINE W REFLEX MICROSCOPIC
Bilirubin Urine: NEGATIVE
Glucose, UA: NEGATIVE
Hgb urine dipstick: NEGATIVE
Ketones, ur: NEGATIVE
Ketones, ur: NEGATIVE
Nitrite: NEGATIVE
Protein, ur: NEGATIVE
Urobilinogen, UA: 0.2
pH: 7

## 2011-05-15 LAB — BASIC METABOLIC PANEL
CO2: 25
Chloride: 103
Creatinine, Ser: 0.48
GFR calc Af Amer: >60
Sodium: 134 — ABNORMAL LOW

## 2011-05-15 LAB — POCT I-STAT, CHEM 8
BUN: 7
Calcium, Ion: 1.27
Chloride: 102

## 2011-05-15 LAB — GC/CHLAMYDIA PROBE AMP, GENITAL: Chlamydia, DNA Probe: NEGATIVE

## 2011-05-16 LAB — URINALYSIS, ROUTINE W REFLEX MICROSCOPIC
Glucose, UA: NEGATIVE
Hgb urine dipstick: NEGATIVE
Ketones, ur: NEGATIVE
Protein, ur: NEGATIVE

## 2011-05-16 NOTE — Telephone Encounter (Signed)
Called Dr.Love's office and requested that they fax an office note and the EEG report.

## 2011-05-19 ENCOUNTER — Telehealth: Payer: Self-pay | Admitting: Internal Medicine

## 2011-05-19 NOTE — Telephone Encounter (Signed)
LM with [name] at [facility] that loop implant has not been scheduled yet because we are waiting the results of her EEG and then Dr.Taylor will decide how to proceed. I also LM that stated the loop implant can stay in place for apx. 12 to 16 months if needed.

## 2011-05-19 NOTE — Telephone Encounter (Signed)
Pt would like for you to call her about her disability.

## 2011-05-19 NOTE — Telephone Encounter (Signed)
Called patient back. She states that she passed out one time last week. Dr.Ross aware of above. She has not gotten back results of EEG done 2 weeks ago by [facility]. Advised her that I left a message for [facility] medical records to send results to me without any success. She will try to call them tomorrow for EEG results and then follow up with me since base of test is peding loop implant.

## 2011-05-19 NOTE — Telephone Encounter (Signed)
Opt 2 off of menu and then [id number].  What date will her loop recorder be placed and for how long will it stay in.  Please call nurse back with this info.

## 2011-05-19 NOTE — Telephone Encounter (Signed)
LMOM for CB

## 2011-05-20 ENCOUNTER — Telehealth: Payer: Self-pay | Admitting: *Deleted

## 2011-05-20 NOTE — Telephone Encounter (Signed)
Patient needs to have a looper implanted.  Forward to K. Lanier/G.Ladona Ridgel Need official report for neuro clinic visit and EEG for record.

## 2011-05-20 NOTE — Telephone Encounter (Signed)
Brandy Carpenter calling to let dr Tenny Craw know that her EEG at [facility] was normal--what's next?--nt

## 2011-05-20 NOTE — Telephone Encounter (Signed)
Pt calling back with results of EEG , pls call

## 2011-05-20 NOTE — Telephone Encounter (Signed)
10/1--LMTCB--nt

## 2011-05-23 LAB — URINALYSIS, ROUTINE W REFLEX MICROSCOPIC
Bilirubin Urine: NEGATIVE
Glucose, UA: NEGATIVE mg/dL
Ketones, ur: NEGATIVE mg/dL
pH: 7.5 (ref 5.0–8.0)

## 2011-06-03 ENCOUNTER — Encounter: Payer: Self-pay | Admitting: *Deleted

## 2011-06-03 NOTE — Telephone Encounter (Signed)
Called patient. Advised that we did finally receive the EEG report. She is now set up for the implantable loop recorder.

## 2011-06-03 NOTE — Telephone Encounter (Signed)
Please call her back.  She is returning call from Oct.  Regarding surgery.

## 2011-06-20 ENCOUNTER — Ambulatory Visit (HOSPITAL_COMMUNITY)
Admission: RE | Admit: 2011-06-20 | Discharge: 2011-06-20 | Disposition: A | Discharge status: Home / Self Care | Payer: Self-pay | Source: Ambulatory Visit | Attending: Internal Medicine | Admitting: Internal Medicine

## 2011-06-20 ENCOUNTER — Ambulatory Visit (HOSPITAL_COMMUNITY): Payer: Self-pay

## 2011-06-20 DIAGNOSIS — R55 Syncope and collapse: Secondary | ICD-10-CM | POA: Insufficient documentation

## 2011-06-20 LAB — CBC
HCT: 37.1 % (ref 36.0–46.0)
Hemoglobin: 11.9 g/dL — ABNORMAL LOW (ref 12.0–15.0)
MCHC: 32.1 g/dL (ref 30.0–36.0)
RBC: 4.81 MIL/uL (ref 3.87–5.11)

## 2011-06-20 LAB — BASIC METABOLIC PANEL
BUN: 7 mg/dL (ref 6–23)
Chloride: 104 mEq/L (ref 96–112)
GFR calc Af Amer: >90 mL/min (ref >90)
Glucose, Bld: 90 mg/dL (ref 70–99)
Potassium: 3.9 mEq/L (ref 3.5–5.1)

## 2011-06-20 LAB — PROTIME-INR: Prothrombin Time: 12.8 seconds (ref 11.6–15.2)

## 2011-06-20 LAB — APTT: aPTT: 36 seconds (ref 24–37)

## 2011-06-22 NOTE — Op Note (Signed)
  [name], Brandy Carpenter                ACCOUNT NO.:  [id number]  MEDICAL RECORD NO.:  [id number]  LOCATION:  [facility]                         FACILITY:  [facility]  PHYSICIAN:  Doylene Canning. Ladona Ridgel, MD    DATE OF BIRTH:  July 31, 1981  DATE OF PROCEDURE:  06/20/2011 DATE OF DISCHARGE:  06/20/2011                              OPERATIVE REPORT   PROCEDURE PERFORMED:  Insertion of an implantable loop recorder.  INDICATION:  Symptomatic unexplained syncope.  INTRODUCTION:  The patient is a 30 year old woman with a history of recurrent unexplained syncope.  She has a history of congenital heart disease.  She is now referred for insertion of an implantable loop recorder.  PROCEDURE:  After informed consent was obtained, the patient was taken to the Diagnostic EP Lab in the fasting state.  After usual preparation and draping, intravenous fentanyl and midazolam was given for sedation. 30 mL of lidocaine was infiltrated into the left pectoral region.  A 3- cm incision was carried out over this region, and electrocautery was utilized to dissect down to the fascial plane.  A subcutaneous pocket was made with electrocautery.  The [employer] confirmed, implantable loop recorder serial [id number] was placed in the subcutaneous pocket and secured with silk suture.  Electrocautery was utilized to assure hemostasis.  Antibiotic irrigation was utilized to irrigate the pocket. The incision was closed with 2-0 and 3-0 Vicryl.  Benzoin and Steri- Strips were painted on the skin, pressure dressing was applied, and the patient was returned to her room in satisfactory condition.  COMPLICATIONS:  There were no immediate procedure complications.  RESULTS:  This demonstrates successful insertion of an implantable loop recorder without immediate procedure complication.     Doylene Canning. Ladona Ridgel, MD     GWT/MEDQ  D:  06/20/2011  T:  06/21/2011  Job:  [id number]  cc:   Pricilla Riffle, MD, [facility]  Electronically Signed by Lewayne Bunting MD on 06/21/2011 11:57:32 PM

## 2011-06-30 ENCOUNTER — Telehealth: Payer: Self-pay | Admitting: Internal Medicine

## 2011-06-30 ENCOUNTER — Ambulatory Visit (INDEPENDENT_AMBULATORY_CARE_PROVIDER_SITE_OTHER): Payer: Self-pay | Admitting: *Deleted

## 2011-06-30 DIAGNOSIS — R55 Syncope and collapse: Secondary | ICD-10-CM

## 2011-06-30 DIAGNOSIS — R42 Dizziness and giddiness: Secondary | ICD-10-CM

## 2011-06-30 DIAGNOSIS — R002 Palpitations: Secondary | ICD-10-CM

## 2011-06-30 LAB — PACEMAKER DEVICE OBSERVATION

## 2011-06-30 NOTE — Telephone Encounter (Signed)
Arline Asp called and wants to know if pt has had loop inplanted or scheduled for implant. What is the date. Please call and advise

## 2011-06-30 NOTE — Telephone Encounter (Signed)
LMOM that Brandy Carpenter had a loop implanted on 06/20/2011.

## 2011-06-30 NOTE — Progress Notes (Signed)
Loop recorder check  

## 2011-07-16 ENCOUNTER — Encounter: Payer: Self-pay | Admitting: Internal Medicine

## 2011-08-08 ENCOUNTER — Encounter: Payer: Self-pay | Admitting: Internal Medicine

## 2011-08-22 ENCOUNTER — Telehealth: Payer: Self-pay | Admitting: Internal Medicine

## 2011-08-22 NOTE — Telephone Encounter (Signed)
Pt also wants to know if anything was recorded on her loop recorder?  She noticed some pain last night around 8:30pm and a couple within the past 2-3 weeks.

## 2011-08-22 NOTE — Telephone Encounter (Signed)
Pt needs to know is her disabilty papers are filled out and sent in yet and she has mad some records on her loop recorder

## 2011-08-22 NOTE — Telephone Encounter (Signed)
Spoke w/pt and pt was thinking when she recorded an episode it was sent to our office. Explained to pt that we would have to check loop in office. Pt scheduled and requested 08-28-11 @ 1100. Pt aware of appt.

## 2011-08-22 NOTE — Telephone Encounter (Signed)
Loop recorder message was forwarded to [place] in the pacer dept.

## 2011-08-22 NOTE — Telephone Encounter (Signed)
Pt states disability papers were faxed here a week or so ago.  She is calling to find out if they are done or when they will be done.

## 2011-08-28 ENCOUNTER — Telehealth: Payer: Self-pay | Admitting: Internal Medicine

## 2011-08-28 ENCOUNTER — Encounter: Payer: Self-pay | Admitting: *Deleted

## 2011-08-28 NOTE — Telephone Encounter (Signed)
New msg: pt calling c/o HTN, severe headaches, some light-headedness/dizziness, and fatigue. Pt said symptoms have been present for about two weeks. Please return pt call to discuss further.

## 2011-08-28 NOTE — Telephone Encounter (Signed)
[employer] form was completed and sent to medical records last week to be faxed.

## 2011-08-28 NOTE — Telephone Encounter (Signed)
Called patient back. She is complaining of headaches and dizziness for at least 2 weeks. Discussed with Dr.Ross. Will get orthostatic BP's on Monday when she comes in for a loop monitor check. Patient will call [facility] concerning headaches.

## 2011-09-01 ENCOUNTER — Ambulatory Visit (INDEPENDENT_AMBULATORY_CARE_PROVIDER_SITE_OTHER): Payer: Self-pay | Admitting: *Deleted

## 2011-09-01 ENCOUNTER — Ambulatory Visit: Payer: Self-pay

## 2011-09-01 ENCOUNTER — Encounter: Payer: Self-pay | Admitting: Internal Medicine

## 2011-09-01 VITALS — BP 141/91 | HR 80

## 2011-09-01 DIAGNOSIS — R42 Dizziness and giddiness: Secondary | ICD-10-CM

## 2011-09-01 DIAGNOSIS — R55 Syncope and collapse: Secondary | ICD-10-CM

## 2011-09-01 LAB — PACEMAKER DEVICE OBSERVATION

## 2011-09-01 NOTE — Progress Notes (Signed)
Will review Orthostatic BP readings with Dr.Ross and call patient back. She had no complaints of dizziness during exam.  Dr.Ross reviewed BP readings. Advised no change in medications. Keep up with fluid intake. LMOM for patient with above information.

## 2011-09-01 NOTE — Progress Notes (Signed)
Wound check-ILR 

## 2011-10-13 ENCOUNTER — Encounter: Payer: Self-pay | Admitting: Internal Medicine

## 2011-11-11 ENCOUNTER — Encounter: Payer: Self-pay | Admitting: Internal Medicine

## 2011-11-11 ENCOUNTER — Ambulatory Visit (INDEPENDENT_AMBULATORY_CARE_PROVIDER_SITE_OTHER): Payer: Self-pay | Admitting: Internal Medicine

## 2011-11-11 VITALS — BP 132/90 | HR 74 | Ht 64.0 in | Wt 201.1 lb

## 2011-11-11 DIAGNOSIS — R002 Palpitations: Secondary | ICD-10-CM

## 2011-11-11 DIAGNOSIS — R55 Syncope and collapse: Secondary | ICD-10-CM

## 2011-11-11 LAB — PACEMAKER DEVICE OBSERVATION

## 2011-11-11 NOTE — Progress Notes (Signed)
HPI [name] returns today for followup. She is a very pleasant 31 yo woman with a h/o unexplained syncope s/p ILR. She denies chest pain, shortness of breath, or peripheral edema. No syncope. She does continue to have episodes where she feels like she might pass out. When these occur, she sits down and typically after 5 or 10 minutes the episodes will pass. No Known Allergies   Current Outpatient Prescriptions  Medication Sig Dispense Refill  . furosemide (LASIX) 20 MG tablet Take 1 tablet daily only as needed for swelling  30 tablet  11  . pindolol (VISKEN) 5 MG tablet Take 1 tablet (5 mg total) by mouth 2 (two) times daily.  60 tablet  11     Past Medical History  Diagnosis Date  . Pulmonic stenosis     a. last echo 9/11: normal LVF, no evidence of PI or PS  . Syncope     Tilt neg. in 2010; likely autonomic dysfunction  . Arnold-Chiari malformation, type I     Dr. Terrace Arabia at [facility]  . HTN (hypertension)   . Edema     ROS:   All systems reviewed and negative except as noted in the HPI.   Past Surgical History  Procedure Date  . Tubal ligation   . Rotator cuff repair      Family History  Problem Relation Age of Onset  . Hypertension Mother   . Diabetes type II Mother   . Hypertension Father      History   Social History  . Marital Status: Married    Spouse Name: N/A    Number of Children: 3  . Years of Education: N/A   Occupational History  . Not on file.   Social History Main Topics  . Smoking status: Never Smoker   . Smokeless tobacco: Not on file  . Alcohol Use: No  . Drug Use: No  . Sexually Active: Not on file   Other Topics Concern  . Not on file   Social History Narrative  . No narrative on file     BP 132/90  Pulse 74  Ht 5\' 4"  (1.626 m)  Wt 91.227 kg (201 lb 1.9 oz)  BMI 34.52 kg/m2  Physical Exam:  Well appearing NAD HEENT: Unremarkable Neck:  No JVD, no thyromegally Lymphatics:  No adenopathy Back:  No CVA tenderness Lungs:   Clear with no wheezes, rales, rhonchi. A small keloid is present over her loop recorder insertion site. HEART:  Regular rate rhythm, no murmurs, no rubs, no clicks Abd:  soft, positive bowel sounds, no organomegally, no rebound, no guarding Ext:  2 plus pulses, no edema, no cyanosis, no clubbing Skin:  No rashes no nodules Neuro:  CN II through XII intact, motor grossly intact  DEVICE  Normal device function.  See PaceArt for details.   Assess/Plan:

## 2011-11-11 NOTE — Patient Instructions (Addendum)
Your physician recommends that you schedule a follow-up appointment in: 3 months in the device clinic And 12 months with Dr Ladona Ridgel

## 2011-11-11 NOTE — Assessment & Plan Note (Signed)
She has had no recurrent syncope. Her implantable loop recorder demonstrates no or tachycardia arrhythmias.

## 2011-11-27 ENCOUNTER — Encounter: Payer: Self-pay | Admitting: Internal Medicine

## 2012-01-08 ENCOUNTER — Telehealth: Payer: Self-pay | Admitting: Internal Medicine

## 2012-01-08 NOTE — Telephone Encounter (Signed)
They faxed paperwork on 4/16 and she has not gotten it back and she was trying to f/u on it

## 2012-01-09 NOTE — Telephone Encounter (Signed)
Called and left message on voice mail that I faxed completed form from Dr.Ross today.

## 2012-02-11 ENCOUNTER — Encounter: Payer: Self-pay | Admitting: Nurse Practitioner

## 2012-02-11 ENCOUNTER — Encounter: Payer: Self-pay | Admitting: Internal Medicine

## 2012-02-11 ENCOUNTER — Ambulatory Visit (INDEPENDENT_AMBULATORY_CARE_PROVIDER_SITE_OTHER): Payer: BC Managed Care – PPO | Admitting: Nurse Practitioner

## 2012-02-11 ENCOUNTER — Ambulatory Visit (INDEPENDENT_AMBULATORY_CARE_PROVIDER_SITE_OTHER): Payer: BC Managed Care – PPO | Admitting: *Deleted

## 2012-02-11 VITALS — BP 136/79 | HR 85 | Ht 64.0 in | Wt 200.0 lb

## 2012-02-11 DIAGNOSIS — R55 Syncope and collapse: Secondary | ICD-10-CM

## 2012-02-11 DIAGNOSIS — R51 Headache: Secondary | ICD-10-CM

## 2012-02-11 NOTE — Progress Notes (Signed)
ILR interrogation in office. 

## 2012-02-11 NOTE — Progress Notes (Signed)
Patient Name: Carolynn Sayers Date of Encounter: 02/11/2012  Primary Care Provider:  No primary provider on file. Primary Cardiologist:  G. Ladona Ridgel, MD  Patient Profile  31 y/o female with h/o syncope s/p ILR who presents with recurrent syncope.  Problem List   Past Medical History  Diagnosis Date  . Pulmonic stenosis     a. last echo 9/11: normal LVF, no evidence of PI or PS  . Syncope     a. Tilt neg. in 2010; likely autonomic dysfunction;  b. s/p ILR  . Arnold-Chiari malformation, type I     a. Dr. Terrace Arabia at [facility]  . HTN (hypertension)   . Edema    Past Surgical History  Procedure Date  . Tubal ligation   . Rotator cuff repair    Allergies  No Known Allergies  HPI  31 y/o female with the above problem list.  She has a long h/o presyncope and syncope and is s/p implantable loop recorder.  She was in her [employer] until 6/17 when she was walking in her home and had sudden onset of dizziness followed by syncope.  This was witnessed by her husband and she apparently regained consciousness within 15-30 seconds.  As is usual with her syncopal spells, when she came to, she felt as though she had "lost time" and didn't immediately know where she was or what had happened.  Usually, she can prevent a syncopal spell by lying down as soon as she becomes LH or her hearing changes, however, on this occasion, she had a very short prodrome.  She's had no recurrent presyncope or syncope since.  Her Loop recorder was interrogated earlier today and showed no events.  Home Medications  Prior to Admission medications   Medication Sig Start Date End Date Taking? Authorizing Provider  furosemide (LASIX) 20 MG tablet Take 1 tablet daily only as needed for swelling 12/09/10  Yes Beatrice Lecher, PA  pindolol (VISKEN) 5 MG tablet Take 1 tablet (5 mg total) by mouth 2 (two) times daily. 12/09/10  Yes Beatrice Lecher, PA   Review of Systems  She has been having headaches as outlined above.  Syncopal spell as  outlined above.  All other systems reviewed and are otherwise negative except as noted above.  Physical Exam  Blood pressure 136/79, pulse 85, height 5\' 4"  (1.626 m), weight 200 lb (90.719 kg).   Orthostatic VS Lying -85, 136/79 Sitting- 83, 137/92 Standing 84, 141/91 (0 mins); 90, 134/86 (2 mins); 91, 132/92 (5 mins). General: Pleasant, NAD Psych: Normal affect. Neuro: Alert and oriented X 3. Moves all extremities spontaneously. HEENT: Normal  Neck: Supple without bruits or JVD. Lungs:  Resp regular and unlabored, CTA. Heart: RRR no s3, s4, or murmurs. Abdomen: Soft, non-tender, non-distended, BS + x 4.  Extremities: No clubbing, cyanosis or edema. DP/PT/Radials 2+ and equal bilaterally.  Accessory Clinical Findings  ECG - rsr, 84.  No acute st/t changes.  Assessment & Plan  1.  Syncope:  Pt w/ syncopal episode on 6/17.  Usually she has a longer prodrome is able to prevent syncope however she did not on this particular occasion.  She sustained no injuries and believes she regained consciousness in 15-30 secs (witnessed by husband).  She has an ILR and this was interrogated earlier today, showing no cardiac events/arrhthmias correlating with her event.  She is not orthostatic.  No additional cardiac eval @ this time.  Cont to follow ILR.  Refer to neurology (see below).  2.  Headaches:  Pt reports having increasingly more headaches which have increased in severity.  She has been seen by neurology in the past and would like another referral, which we will arrange.    Nicolasa Ducking, NP 02/11/2012, 4:02 PM

## 2012-02-11 NOTE — Patient Instructions (Addendum)
You have been referred to [facility], Dr. Marjory Lies for evaluation and treatment of your non-cardiac fainting spells and headaches.

## 2012-02-26 ENCOUNTER — Encounter: Payer: Self-pay | Admitting: Internal Medicine

## 2012-03-16 ENCOUNTER — Encounter: Payer: Self-pay | Admitting: Internal Medicine

## 2012-04-05 ENCOUNTER — Telehealth: Payer: Self-pay | Admitting: Internal Medicine

## 2012-04-05 NOTE — Telephone Encounter (Signed)
Called patient back. She is still out of work due to syncopal episodes. She states that no rhythm issues have been documented. She will see neuro soon. Waiting for an appointment from their scheduling dept. She had to pay some back bills to obtain an appointment.  Advised that Dr.Ross has her disability paperwork and will complete ASAP. Will let Dr.Ross know that she called.

## 2012-04-05 NOTE — Telephone Encounter (Signed)
New Problem:    Patient called in wanting to know what the status was of the long term disability papers she faxed into Dr. Tenny Craw 2 weeks ago.  Please call back.

## 2012-04-06 NOTE — Telephone Encounter (Signed)
Called patient to inform her that paper work was completed by [employer] and faxed to [employer].

## 2012-04-13 IMAGING — US US ABDOMEN COMPLETE
1 series · 14 of 25 positions shown · non-contrast
Comparison: None.

CLINICAL DATA: Nausea and vomiting

ABDOMINAL ULTRASOUND COMPLETE

[Series 1: us abdomen complete · 0.25mm/px · 14 of 77 slices shown]
[im 1/77]
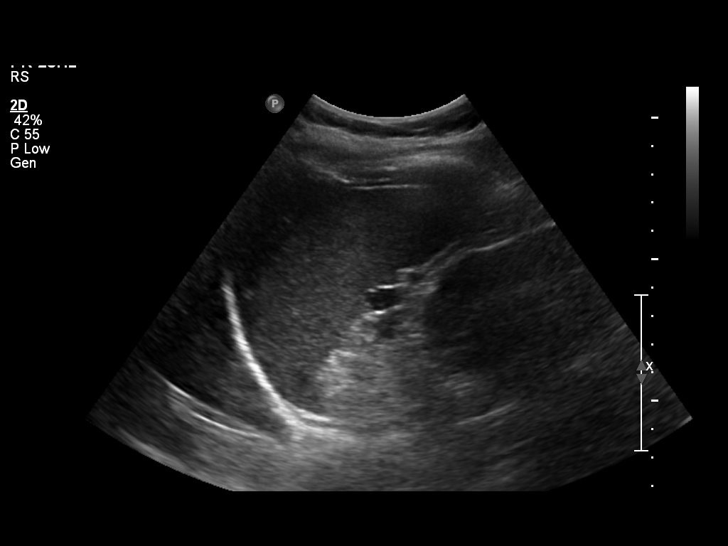
[im 7/77]
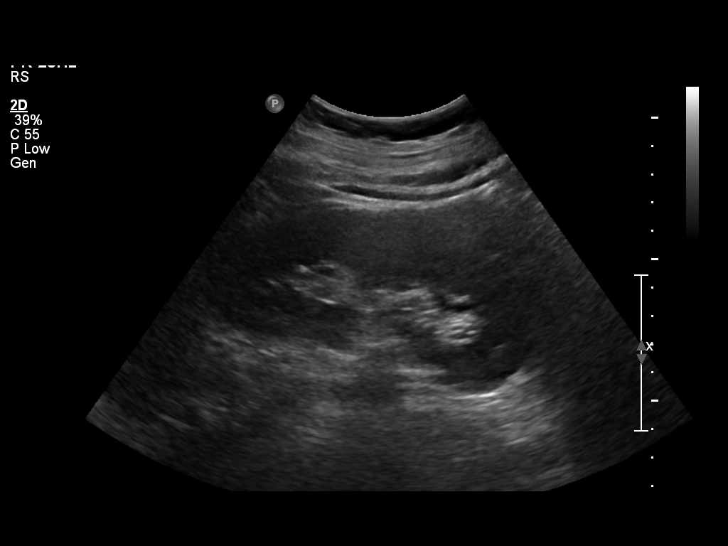
[im 13/77]
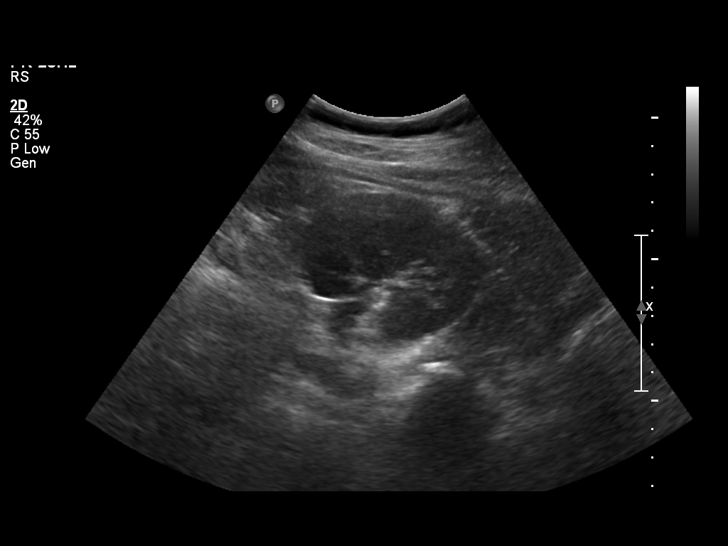
[im 20/77]
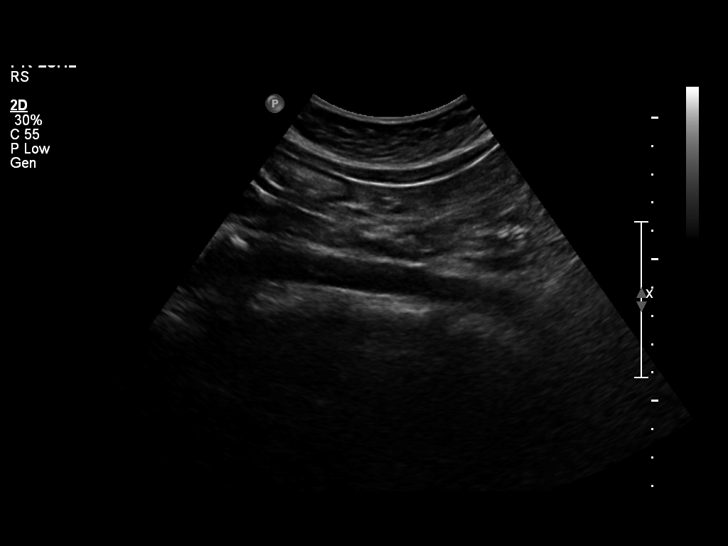
[im 26/77]
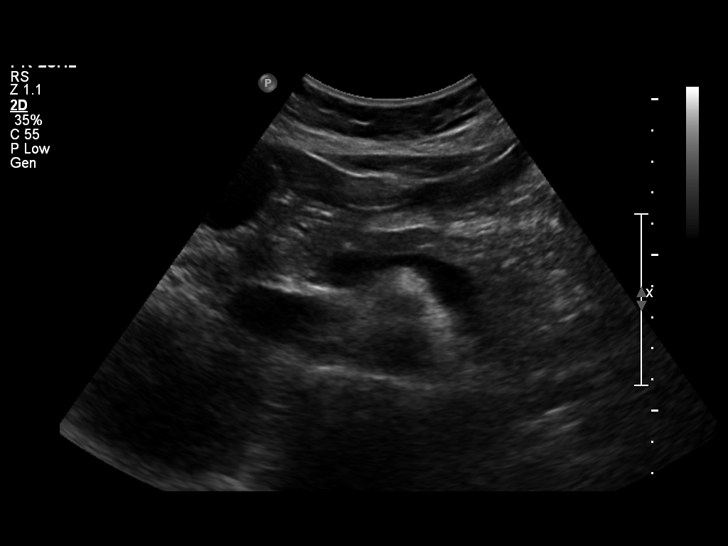
[im 29/77]
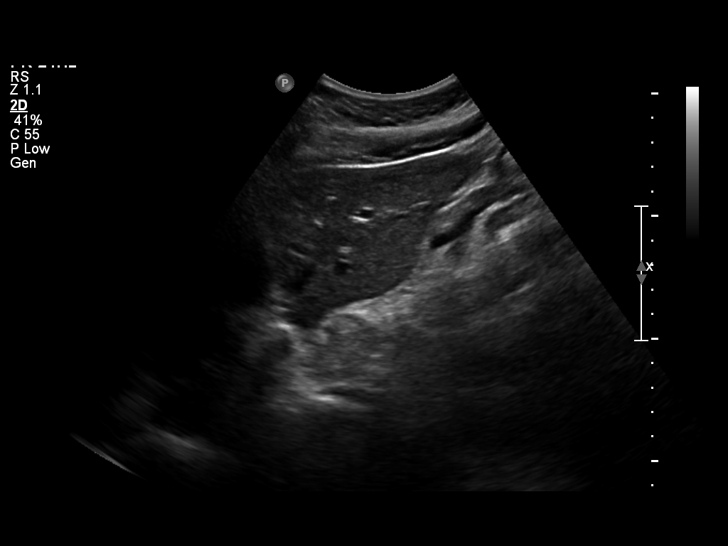
[im 35/77]
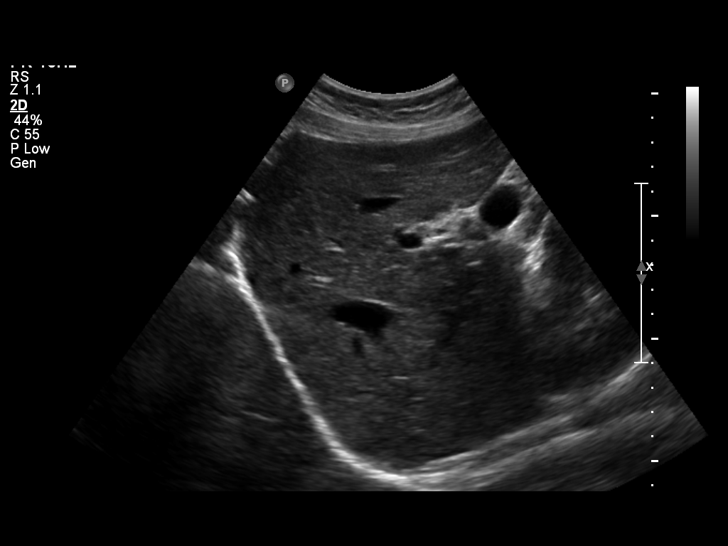
[im 42/77]
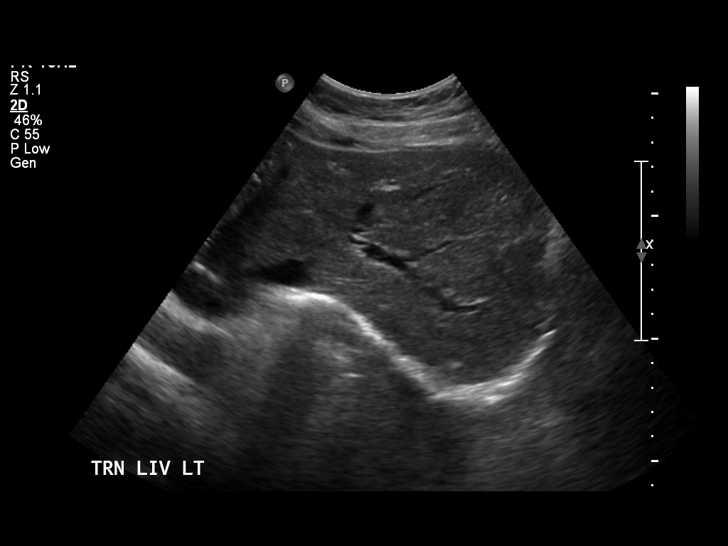
[im 48/77]
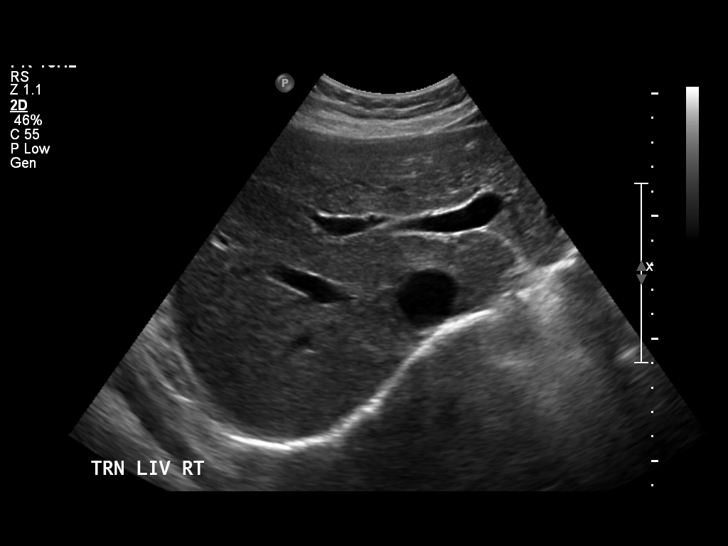
[im 51/77]
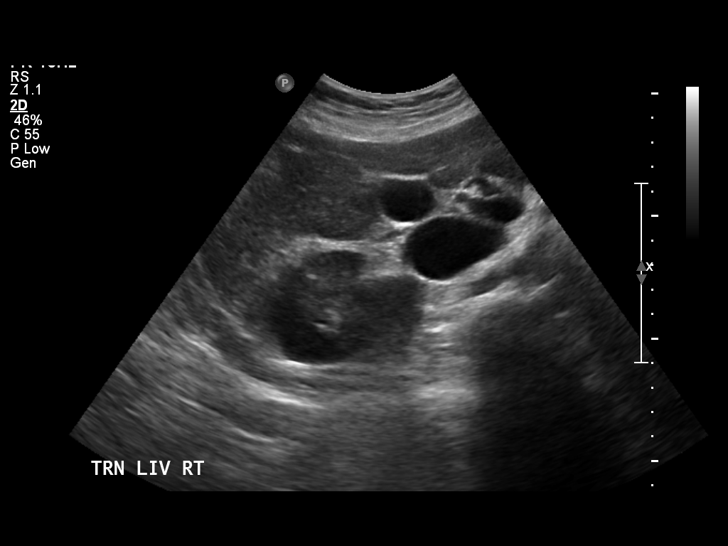
[im 58/77]
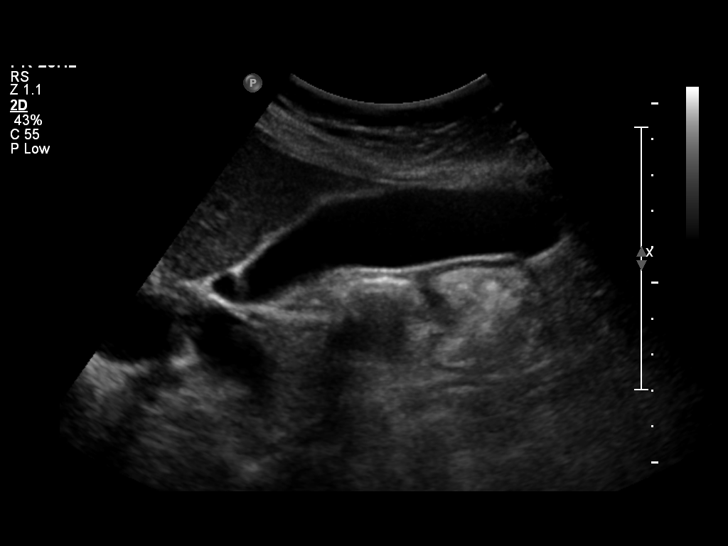
[im 64/77]
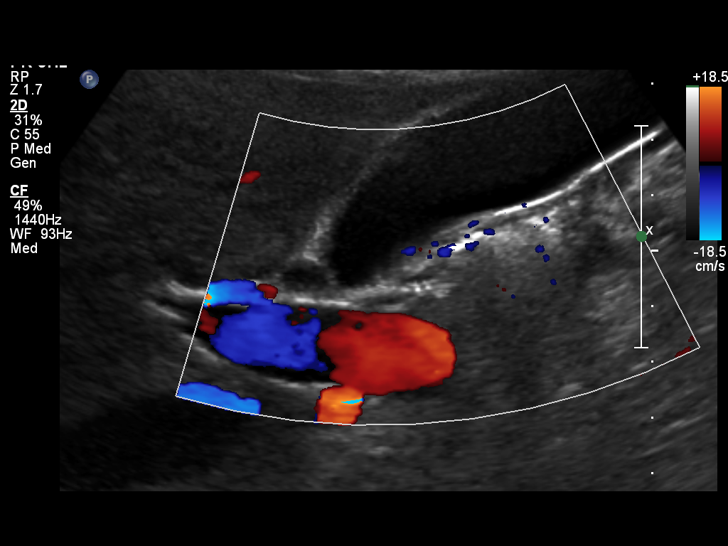
[im 70/77]
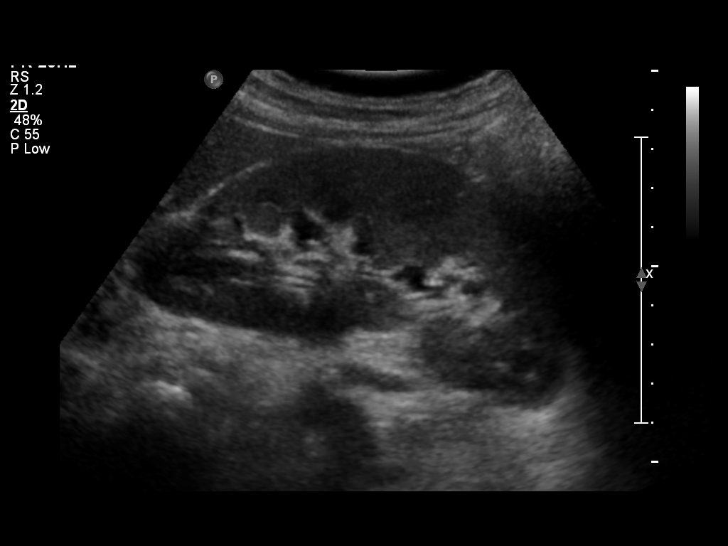
[im 77/77]
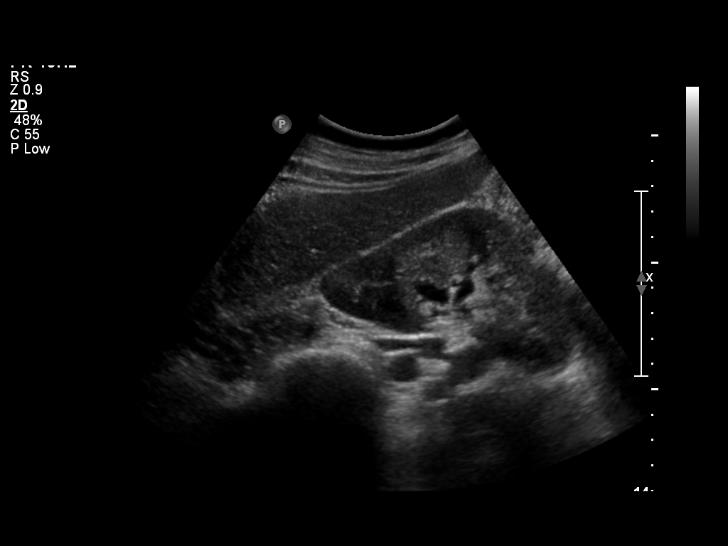

[14 of 25 positions shown; findings below may reference images not displayed]

FINDINGS: Gallbladder:  No gallstones, gallbladder wall thickening, or
pericholecystic fluid.

Common Bile Duct:  Within normal limits in caliber.

Liver: No focal mass lesion identified.  Within normal limits in
parenchymal echogenicity.

IVC:  Appears normal.

Pancreas:  No abnormality identified, although entire pancreas
cannot be visualized by ultrasound.

Spleen:  Within normal limits in size and echotexture.

Right kidney:  Normal in size and parenchymal echogenicity.  No
evidence of mass.  Mild pelvocaliectasis.

Left kidney:  Normal in size and parenchymal echogenicity.  No
evidence of mass.  Mild pelvocaliectasis.

Abdominal Aorta:  No aneurysm identified.
IMPRESSION: Negative abdominal ultrasound.

## 2012-04-22 ENCOUNTER — Telehealth: Payer: Self-pay | Admitting: Internal Medicine

## 2012-04-22 NOTE — Telephone Encounter (Signed)
Pt calling re info fax from disablity on 04-22-12, pls call , knows it will be tomorrow

## 2012-04-26 NOTE — Telephone Encounter (Signed)
LMOM for call back. Will fax last 3 office notes.

## 2012-05-04 ENCOUNTER — Encounter: Payer: Self-pay | Admitting: Internal Medicine

## 2012-05-14 ENCOUNTER — Encounter: Payer: BC Managed Care – PPO | Admitting: Cardiology

## 2012-05-20 ENCOUNTER — Encounter: Payer: BC Managed Care – PPO | Admitting: Cardiology

## 2012-05-21 ENCOUNTER — Encounter: Payer: Self-pay | Admitting: Cardiology

## 2012-05-21 ENCOUNTER — Ambulatory Visit (INDEPENDENT_AMBULATORY_CARE_PROVIDER_SITE_OTHER): Payer: BC Managed Care – PPO | Admitting: Cardiology

## 2012-05-21 VITALS — BP 138/82 | HR 88 | Ht 64.0 in | Wt 201.0 lb

## 2012-05-21 DIAGNOSIS — R55 Syncope and collapse: Secondary | ICD-10-CM

## 2012-05-21 DIAGNOSIS — Z4509 Encounter for adjustment and management of other cardiac device: Secondary | ICD-10-CM

## 2012-05-21 DIAGNOSIS — R071 Chest pain on breathing: Secondary | ICD-10-CM

## 2012-05-21 NOTE — Progress Notes (Signed)
ELECTROPHYSIOLOGY OFFICE NOTE  Patient ID: Brandy Carpenter MRN: [id number], DOB/AGE: 1980-09-15   Date of Visit: 05/21/2012  Primary Physician: Establishing with PCP in [place], [place] on 06/03/2012 Primary Cardiologist: Ladona Ridgel, MD Reason for Visit: Loop recorder follow-up  History of Present Illness Brandy Carpenter is a pleasant 31 year old woman with recurrent syncope s/p ILR who presents today for loop recorder check. She denies recent syncope. She reports "sharp" chest pain with deep inspiration, occurring intermittently x 3 days. She reports it is located under her left breast. She states it feels as though it "catches" when she breathes and it will resolve on its own over time, in just a few seconds. She denies any other accompanying symptoms. She denies any positional/postural changes. She denies recent illness, fever or chills. She denies exertional chest pain or chest discomfort. She denies SOB or palpitations. She is quite active as the mother of 3 young boys. She is awaiting neurology follow-up this month for recurrent HAs.   Past Medical History Past Medical History  Diagnosis Date  . Pulmonic stenosis     a. last echo 9/11: normal LVF, no evidence of PI or PS  . Syncope     a. Tilt neg. in 2010; likely autonomic dysfunction;  b. s/p ILR  . Arnold-Chiari malformation, type I     a. Dr. Terrace Arabia at [facility]  . HTN (hypertension)   . Edema     Past Surgical History Past Surgical History  Procedure Date  . Tubal ligation   . Rotator cuff repair      Allergies/Intolerances No Known Allergies  Current Home Medications Current Outpatient Prescriptions  Medication Sig Dispense Refill  . ferrous fumarate (HEMOCYTE - 106 MG FE) 325 (106 FE) MG TABS Take 1 tablet by mouth.      . furosemide (LASIX) 20 MG tablet Take 1 tablet daily only as needed for swelling  30 tablet  11  . pindolol (VISKEN) 5 MG tablet Take 1 tablet (5 mg total) by mouth 2 (two) times daily.  60 tablet  11  . potassium  chloride SA (K-DUR,KLOR-CON) 20 MEQ tablet Take 20 mEq by mouth as needed.        Social History History   Social History  . Marital Status: Married    Number of Children: 3   Social History Main Topics  . Smoking status: Never Smoker   . Smokeless tobacco: No  . Alcohol Use: No  . Drug Use: No   Review of Systems General: No chills, fever, night sweats or weight changes Cardiovascular: No exertional chest pain, dyspnea on exertion, edema, orthopnea, palpitations, paroxysmal nocturnal dyspnea Dermatological: No rash, lesions or masses Respiratory: No cough, dyspnea Urologic: No hematuria, dysuria Abdominal: No nausea, vomiting, diarrhea, bright red blood per rectum, melena, or hematemesis Neurologic: No visual changes, weakness, changes in mental status All other systems reviewed and are otherwise negative except as noted above.  Physical Exam Blood pressure 138/82, pulse 88, height 5\' 4"  (1.626 m), weight 201 lb (91.173 kg).  General: Well developed, well appearing 31 year old female in no acute distress. HEENT: Normocephalic, atraumatic. EOMs intact. Sclera nonicteric. Oropharynx clear.  Neck: Supple. No JVD. Lungs: Respirations regular and unlabored, CTA bilaterally. No wheezes, rales or rhonchi. Heart: RRR. S1, S2 present. No murmurs, rub, S3 or S4. Abdomen: Soft, non-distended.  Extremities: No clubbing, cyanosis or edema. DP/PT/Radials 2+ and equal bilaterally. Psych: Normal affect. Musculoskeletal: No kyphosis. Neuro: Alert and oriented X 3. Moves all  extremities spontaneously.   Diagnostics 12-lead ECG shows normal sinus rhythm with normal intervals at 83 bpm; PR 152 msec, QRS 90 msec, QT/QTc 360/423 msec; unchanged from previous ECG dated June 2013 Loop recorder interrogation performed today by me in clinic shows good battery status, estimated longevity 31 months; R waves >/= 0.36 mV; no episodes; see [employer] report  Assessment and Plan 1. Chest pain - atypical  for angina; sounds pleuritic, clearly related to inspiration; 12-lead ECG shows SR with no ST-T wave abnormalities; reassured Brandy Carpenter that her symptoms are not typical for angina 2. Recurrent syncope - ILR interrogation today shows no episodes; continue routine device follow-up in 6 months unless needed sooner  Brandy Carpenter expressed verbal understanding and agrees with this plan of care. Signed, Rick Duff, PA-C 05/21/2012, 2:47 PM

## 2012-05-21 NOTE — Patient Instructions (Signed)
Your physician wants you to follow-up in: 6 months with Dr Taylor You will receive a reminder letter in the mail two months in advance. If you don't receive a letter, please call our office to schedule the follow-up appointment.  

## 2012-06-02 ENCOUNTER — Telehealth: Payer: Self-pay | Admitting: Internal Medicine

## 2012-06-02 ENCOUNTER — Encounter: Payer: Self-pay | Admitting: Cardiology

## 2012-06-02 NOTE — Telephone Encounter (Signed)
Pt's primary Cardiologist is Dr Tenny Craw.  Dr Ladona Ridgel only implanter the loop recorder and sees her yearly.  Dr Tenny Craw has previously filled out paper work for her with her job

## 2012-06-02 NOTE — Telephone Encounter (Signed)
plz return call to pt [phone] regarding return to work documentation.

## 2012-06-02 NOTE — Telephone Encounter (Signed)
PT CALLED DISABILITY OFFICE  FOR SOME INFO   AND WAS TOLD AT TIME THAT SHE DID NOT  QUALIFY FOR DISABILITY  NO LONGER. PT WAS JUST RECENTLY SEEN IN OFFICE AND NO ONE INFORMED HER OF THAT  PT WANDERING  HOW COME  WE DID NOT  LET HER KNOW  AWARE  WILL FORWARD TO DR ROSS NOT SURE  WHY   NO LONGER QUALIFIES./CY

## 2012-06-04 ENCOUNTER — Telehealth: Payer: Self-pay | Admitting: Internal Medicine

## 2012-06-04 NOTE — Telephone Encounter (Signed)
Pt wants a call so you can get a clear understanding what she wants

## 2012-06-04 NOTE — Telephone Encounter (Signed)
See note from 06/04/2012.

## 2012-06-04 NOTE — Telephone Encounter (Signed)
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 Have not found any rhythm problems on loop monitor. She has an appointment with neuro on 10/31 and will discuss headaches and last syncopal episode that was in June.

## 2012-06-23 ENCOUNTER — Ambulatory Visit (INDEPENDENT_AMBULATORY_CARE_PROVIDER_SITE_OTHER): Payer: BC Managed Care – PPO | Admitting: *Deleted

## 2012-06-23 DIAGNOSIS — R55 Syncope and collapse: Secondary | ICD-10-CM

## 2012-06-23 LAB — PACEMAKER DEVICE OBSERVATION

## 2012-06-23 NOTE — Patient Instructions (Addendum)
Return office visit 09/28/12 @ 2:45pm with Dr. Ladona Ridgel.

## 2012-06-23 NOTE — Progress Notes (Signed)
ILR interrogation 

## 2012-07-19 ENCOUNTER — Encounter: Payer: Self-pay | Admitting: Internal Medicine

## 2012-07-29 ENCOUNTER — Telehealth: Payer: Self-pay | Admitting: Internal Medicine

## 2012-08-02 ENCOUNTER — Ambulatory Visit (INDEPENDENT_AMBULATORY_CARE_PROVIDER_SITE_OTHER): Payer: BC Managed Care – PPO | Admitting: Nurse Practitioner

## 2012-08-02 ENCOUNTER — Encounter: Payer: Self-pay | Admitting: Nurse Practitioner

## 2012-08-02 VITALS — BP 138/80 | HR 84 | Ht 64.0 in | Wt 203.4 lb

## 2012-08-02 DIAGNOSIS — R55 Syncope and collapse: Secondary | ICD-10-CM

## 2012-08-02 NOTE — Patient Instructions (Addendum)
Call back and let us know the name of the cardiologist that you saw at [facility]. Dr. Tenny Craw will be more than happy to call and speak with him.  Call the [facility] office at [phone] if you have any questions, problems or concerns.

## 2012-08-02 NOTE — Progress Notes (Addendum)
Brandy Carpenter Date of Birth: 01/07/1981 Medical Record #[id number]  History of Present Illness: Brandy Carpenter is seen today for a work in visit. She is subsequently seen with Dr. Tenny Craw. She has had a history of syncope and has a loop recorder in place. Her other problems are as noted below.   She comes in today. She is here with her husband. 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 Has not had any syncope over the past 6 months and says she has had no symptoms. She feels "like she is ready to drive". She does drive her personal vehicle. She has had a 2nd opinion with a cardiologist from [place] (she does not know the name). She says that he has given her the ok to drive. He has also cut her Pindolol back. No chest pain. Was in the ER down in [place] last week with some pain with inspiration and had a negative evaluation per her report.   Current Outpatient Prescriptions on File Prior to Visit  Medication Sig Dispense Refill  . ferrous fumarate (HEMOCYTE - 106 MG FE) 325 (106 FE) MG TABS Take 1 tablet by mouth.      . furosemide (LASIX) 20 MG tablet Take 1 tablet daily only as needed for swelling  30 tablet  11  . pindolol (VISKEN) 5 MG tablet Take 5 mg by mouth daily.      . potassium chloride SA (K-DUR,KLOR-CON) 20 MEQ tablet Take 20 mEq by mouth as needed.        No Known Allergies  Past Medical History  Diagnosis Date  . Pulmonic stenosis     a. last echo 9/11: normal LVF, no evidence of PI or PS  . Syncope     a. Tilt neg. in 2010; likely autonomic dysfunction;  b. s/p ILR  . Arnold-Chiari malformation, type I     a. Dr. Terrace Arabia at [facility]  . HTN (hypertension)   . Edema     Past Surgical History  Procedure Date  . Tubal ligation   . Rotator cuff repair     History  Smoking status  . Never Smoker   Smokeless tobacco  . Not on file    History  Alcohol Use No    Family History  Problem Relation Age of Onset  . Hypertension Mother   . Diabetes type II Mother   .  Hypertension Father     Review of Systems: The review of systems is per the HPI.  All other systems were reviewed and are negative.  Physical Exam: BP 138/80  Pulse 84  Ht 5\' 4"  (1.626 m)  Wt 203 lb 6.4 oz (92.262 kg)  BMI 34.91 kg/m2 Patient is very pleasant and in no acute distress. She is obese. Skin is warm and dry. Color is normal.  HEENT is unremarkable. Normocephalic/atraumatic. PERRL. Sclera are nonicteric. Neck is supple. No masses. No JVD. Lungs are clear. Cardiac exam shows a regular rate and rhythm. Abdomen is soft. Extremities are without edema. Gait and ROM are intact. No gross neurologic deficits noted.  LABORATORY DATA: Lab Results  Component Value Date   WBC 5.7 06/20/2011   HGB 11.9* 06/20/2011   HCT 37.1 06/20/2011   PLT 235 06/20/2011   GLUCOSE 90 06/20/2011   ALT 27 10/17/2010   AST 26 10/17/2010   NA 138 06/20/2011   K 3.9 06/20/2011   CL 104 06/20/2011   CREATININE 0.64 06/20/2011   BUN 7 06/20/2011  CO2 24 06/20/2011   TSH 0.64 01/09/2011   INR 0.94 06/20/2011    Dg Chest 2 View Done In [facility].  06/20/2011  *RADIOLOGY REPORT*  Clinical Data: Preoperative for implantable loop recorder. Hypertension.  CHEST - 2 VIEW  Comparison: 01/11/2009  Findings: Cardiac and mediastinal contours appear normal.  The lungs appear clear.  No pleural effusion is identified.  IMPRESSION:  No significant abnormality identified.  Original Report Authenticated By: Dellia Cloud, M.D.   Assessment / Plan:  1. Syncope - of uncertain etiology - has a loop recorder in place that has been unrevealing. There has felt to be a component of orthostasis in the past. Will check orthostatics today. We have asked her to call us with the number of her cardiologist in [facility] and Dr. Tenny Craw will be more than happy to speak with him. Both Dr. 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   Patient is agreeable to this plan and  will call if any problems develop in the interim.    Addendum: Lying BP 148/93 with HR of 83 Sitting BP 145/94 with HR of 85 Standing BP 133/92 with HR 89 (complains of dizziness) Standing at 2 min BP 145/102 with HR 86 (complains of dizziness) Standing at 5 min BP 145/99 with HR 85 (complains of dizziness)  My CMA noted that she did appear dizzy with standing. This is apparently how she always feels.

## 2012-08-10 ENCOUNTER — Telehealth: Payer: Self-pay | Admitting: Internal Medicine

## 2012-08-10 NOTE — Telephone Encounter (Signed)
Pt left info from [facility] Cardiologist   Dr. Lillard Anes [phone] Pt said she ses Dr. Laurene Footman as well, wanted to leave information

## 2012-08-16 NOTE — Telephone Encounter (Signed)
F/u   Have Dr. Tenny Craw consult with [facility] Cardiologist .  Patient stating that situation is getting worse.

## 2012-09-28 ENCOUNTER — Encounter: Payer: BC Managed Care – PPO | Admitting: Internal Medicine

## 2012-10-25 ENCOUNTER — Encounter: Payer: BC Managed Care – PPO | Admitting: Internal Medicine

## 2012-12-06 ENCOUNTER — Telehealth: Payer: Self-pay | Admitting: Internal Medicine

## 2012-12-06 NOTE — Telephone Encounter (Signed)
Spoke with Dr. Thomasena Edis with [employer] who wanted to know the last office visit and whether or not Dr. Tenny Craw had forbidden any work at all or if she has just restricted driving.  I read to Dr. Thomasena Edis the last office note from Norma Fredrickson, NP.  Dr. Thomasena Edis states that she is trying to determine whether patient is completely disabled or if she has only been forbidden to drive.  I informed Dr. Thomasena Edis that I would forward note to Dr. Tenny Craw.  Dr. Thomasena Edis states that if Dr. Tenny Craw wants to add anything else regarding the patient's medical condition to please call her at [phone].

## 2012-12-06 NOTE — Telephone Encounter (Signed)
New problem      Dr Thomasena Edis w/[employer] calling regarding patients disability--stating that patient is saying is totally disabled but dr Thomasena Edis wants to verify the restrictions that dr Tenny Craw put in place on august 19th-dr collins is aware that dr Tenny Craw is not here but felt a nurse could possibly answer this question

## 2012-12-08 ENCOUNTER — Encounter: Payer: BC Managed Care – PPO | Admitting: Internal Medicine

## 2012-12-14 ENCOUNTER — Encounter: Payer: Self-pay | Admitting: Internal Medicine

## 2012-12-22 NOTE — Telephone Encounter (Signed)
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

## 2012-12-22 NOTE — Telephone Encounter (Signed)
[employer] notified.

## 2013-02-23 ENCOUNTER — Telehealth: Payer: Self-pay | Admitting: Internal Medicine

## 2013-02-23 NOTE — Telephone Encounter (Signed)
Pt called in wants to know if she needs to reschedule her missed loop recorder appointment. Pt canceled her appointments scheduled  for 09/28/12, 10/25/12, and no show on 12/08/12. Pt needs to be call prior making the appointment, because she needs to see if she can be off work that day.

## 2013-02-23 NOTE — Telephone Encounter (Signed)
New Problem:    Patient called in wanting to know if she needed to reschedule her missed Loop Recorder appointment.  Please call back.

## 2013-02-28 NOTE — Telephone Encounter (Signed)
Brandy Carpenter has left patient a message to schedule her appointment.  Waiting for her to call back

## 2013-03-15 ENCOUNTER — Encounter: Payer: Self-pay | Admitting: Internal Medicine

## 2014-03-02 NOTE — Telephone Encounter (Signed)
Close encounter
